# Patient Record
Sex: Female | Born: 1970 | Race: White | Hispanic: No | Marital: Married | Smoking: Never smoker
Health system: Southern US, Community
[De-identification: ages and names within clinical notes are randomized; demographics above are authoritative.]

## PROBLEM LIST (undated history)

## (undated) DIAGNOSIS — C801 Malignant (primary) neoplasm, unspecified: Secondary | ICD-10-CM

## (undated) DIAGNOSIS — B1920 Unspecified viral hepatitis C without hepatic coma: Secondary | ICD-10-CM

## (undated) DIAGNOSIS — N390 Urinary tract infection, site not specified: Secondary | ICD-10-CM

## (undated) HISTORY — DX: Malignant (primary) neoplasm, unspecified: C80.1

## (undated) HISTORY — DX: Unspecified viral hepatitis C without hepatic coma: B19.20

## (undated) HISTORY — PX: TUBAL LIGATION: SHX77

## (undated) HISTORY — DX: Urinary tract infection, site not specified: N39.0

---
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
## 1989-04-22 HISTORY — PX: CHOLECYSTECTOMY: SHX55

## 2005-04-22 HISTORY — PX: NECK SURGERY: SHX720

## 2005-09-17 ENCOUNTER — Ambulatory Visit: Payer: Self-pay | Admitting: Gastroenterology

## 2007-04-08 ENCOUNTER — Encounter (INDEPENDENT_AMBULATORY_CARE_PROVIDER_SITE_OTHER): Payer: Self-pay | Admitting: Interventional Radiology

## 2007-04-08 ENCOUNTER — Ambulatory Visit (HOSPITAL_COMMUNITY): Admission: RE | Admit: 2007-04-08 | Discharge: 2007-04-08 | Payer: Self-pay | Admitting: Gastroenterology

## 2007-04-21 ENCOUNTER — Ambulatory Visit: Payer: Self-pay | Admitting: Gastroenterology

## 2009-04-22 DIAGNOSIS — C801 Malignant (primary) neoplasm, unspecified: Secondary | ICD-10-CM

## 2009-04-22 DIAGNOSIS — C50919 Malignant neoplasm of unspecified site of unspecified female breast: Secondary | ICD-10-CM

## 2009-04-22 HISTORY — DX: Malignant (primary) neoplasm, unspecified: C80.1

## 2009-04-22 HISTORY — PX: BREAST BIOPSY: SHX20

## 2009-04-22 HISTORY — PX: REDUCTION MAMMAPLASTY: SUR839

## 2009-04-22 HISTORY — PX: MASTECTOMY: SHX3

## 2009-04-22 HISTORY — DX: Malignant neoplasm of unspecified site of unspecified female breast: C50.919

## 2011-01-25 LAB — CBC
MCHC: 33.8
Platelets: 241
RBC: 5.03
RDW: 13.1

## 2013-04-28 LAB — HM PAP SMEAR: HM Pap smear: NORMAL

## 2014-04-22 LAB — HM MAMMOGRAPHY: HM Mammogram: NORMAL

## 2014-11-21 HISTORY — PX: NOVASURE ABLATION: SHX5394

## 2015-08-11 ENCOUNTER — Telehealth: Payer: Self-pay | Admitting: Behavioral Health

## 2015-08-11 ENCOUNTER — Encounter: Payer: Self-pay | Admitting: Behavioral Health

## 2015-08-11 NOTE — Telephone Encounter (Signed)
Pre-Visit Call completed with patient and chart updated.   Pre-Visit Info documented in Specialty Comments under SnapShot.    

## 2015-08-14 ENCOUNTER — Ambulatory Visit (INDEPENDENT_AMBULATORY_CARE_PROVIDER_SITE_OTHER): Payer: Managed Care, Other (non HMO) | Admitting: Family Medicine

## 2015-08-14 ENCOUNTER — Encounter: Payer: Self-pay | Admitting: Family Medicine

## 2015-08-14 VITALS — BP 131/86 | HR 72 | Temp 98.3°F | Ht 64.17 in | Wt 164.2 lb

## 2015-08-14 DIAGNOSIS — R300 Dysuria: Secondary | ICD-10-CM | POA: Diagnosis not present

## 2015-08-14 DIAGNOSIS — Z1329 Encounter for screening for other suspected endocrine disorder: Secondary | ICD-10-CM | POA: Diagnosis not present

## 2015-08-14 DIAGNOSIS — Z0000 Encounter for general adult medical examination without abnormal findings: Secondary | ICD-10-CM | POA: Diagnosis not present

## 2015-08-14 DIAGNOSIS — Z13220 Encounter for screening for lipoid disorders: Secondary | ICD-10-CM

## 2015-08-14 DIAGNOSIS — Z131 Encounter for screening for diabetes mellitus: Secondary | ICD-10-CM

## 2015-08-14 DIAGNOSIS — Z23 Encounter for immunization: Secondary | ICD-10-CM | POA: Diagnosis not present

## 2015-08-14 DIAGNOSIS — F411 Generalized anxiety disorder: Secondary | ICD-10-CM | POA: Insufficient documentation

## 2015-08-14 DIAGNOSIS — Z130 Encounter for screening for diseases of the blood and blood-forming organs and certain disorders involving the immune mechanism: Secondary | ICD-10-CM | POA: Diagnosis not present

## 2015-08-14 DIAGNOSIS — G4700 Insomnia, unspecified: Secondary | ICD-10-CM | POA: Insufficient documentation

## 2015-08-14 LAB — COMPREHENSIVE METABOLIC PANEL
ALK PHOS: 35 U/L — AB (ref 39–117)
ALT: 11 U/L (ref 0–35)
AST: 13 U/L (ref 0–37)
Albumin: 3.9 g/dL (ref 3.5–5.2)
BUN: 12 mg/dL (ref 6–23)
CALCIUM: 9.4 mg/dL (ref 8.4–10.5)
CO2: 30 mEq/L (ref 19–32)
Chloride: 103 mEq/L (ref 96–112)
Creatinine, Ser: 0.73 mg/dL (ref 0.40–1.20)
GFR: 91.38 mL/min (ref >60.00)
GLUCOSE: 94 mg/dL (ref 70–99)
POTASSIUM: 4.1 meq/L (ref 3.5–5.1)
Sodium: 139 mEq/L (ref 135–145)
TOTAL PROTEIN: 7.2 g/dL (ref 6.0–8.3)
Total Bilirubin: 0.5 mg/dL (ref 0.2–1.2)

## 2015-08-14 LAB — POC URINALSYSI DIPSTICK (AUTOMATED)
Bilirubin, UA: NEGATIVE
Blood, UA: NEGATIVE
GLUCOSE UA: NEGATIVE
LEUKOCYTES UA: NEGATIVE
NITRITE UA: NEGATIVE
PROTEIN UA: NEGATIVE
SPEC GRAV UA: 1.02
UROBILINOGEN UA: 0.2
pH, UA: 6

## 2015-08-14 LAB — LIPID PANEL
CHOL/HDL RATIO: 4
CHOLESTEROL: 209 mg/dL — AB (ref 0–200)
HDL: 57.2 mg/dL (ref >39.00)
LDL Cholesterol: 134 mg/dL — ABNORMAL HIGH (ref 0–99)
NonHDL: 151.74
TRIGLYCERIDES: 91 mg/dL (ref 0.0–149.0)
VLDL: 18.2 mg/dL (ref 0.0–40.0)

## 2015-08-14 LAB — CBC
HEMATOCRIT: 43.4 % (ref 36.0–46.0)
HEMOGLOBIN: 14.3 g/dL (ref 12.0–15.0)
MCHC: 33 g/dL (ref 30.0–36.0)
MCV: 86.8 fl (ref 78.0–100.0)
PLATELETS: 209 10*3/uL (ref 150.0–400.0)
RBC: 5 Mil/uL (ref 3.87–5.11)
RDW: 14.4 % (ref 11.5–15.5)
WBC: 7.5 10*3/uL (ref 4.0–10.5)

## 2015-08-14 LAB — HEMOGLOBIN A1C: Hgb A1c MFr Bld: 5.1 % (ref 4.6–6.5)

## 2015-08-14 LAB — TSH: TSH: 2.33 u[IU]/mL (ref 0.35–4.50)

## 2015-08-14 MED ORDER — ZOLPIDEM TARTRATE 10 MG PO TABS: ORAL_TABLET | ORAL | Status: DC

## 2015-08-14 MED ORDER — CLONAZEPAM 0.5 MG PO TABS: 0.5000 mg | ORAL_TABLET | Freq: Every day | ORAL | Status: DC

## 2015-08-14 NOTE — Patient Instructions (Signed)
It was very nice to meet you today!  I will be in touch with your labs asap.  If you like, set up your mychart account for easy communication We will screening for diabetes, thyroid issues, high cholesterol and anemia You got your tetanus shot today Continue to use the ambien for sleep and the klonpin for anxiety- use these medications as sparingly as you can and do not combine with alcohol or drive while on these medications. Do not use these medications together

## 2015-08-14 NOTE — Progress Notes (Signed)
Pre visit review using our clinic review tool, if applicable. No additional management support is needed unless otherwise documented below in the visit note. 

## 2015-08-14 NOTE — Progress Notes (Addendum)
[place] at [facility] [address] [place], [place] [address] [phone]  Date:  08/14/2015   Name:  Shirley Holder   DOB:  [birth year]   MRN:  [id number]  PCP:  Lamar Blinks, MD    Chief Complaint: Establish Care   History of Present Illness:  Shirley Holder is a 45 y.o. very pleasant female patient who presents with the following:  Here today to establish care.   She has been seeing Dr. Tamala Julian for her pap,etc but would like to change to our office She was dx with breast cancer in 2011- she had a left mastectomy.  She just had surgical treatment, did not need chemo or radiation.  she will schedule her follow- up mammogram soon and will have the results sent to me She had a novsure ablation about one year ago due to menorrhagia- this is now improved  She did have some neck surgery about 10 years ago- her neck is doing well.  She was treated for hep C 4 years ago- she was treated and infection cleared.  She was told to see her hepatologist every 5 years at this point.    She has had some trouble with her right heel for 3 weeks or so.  It hurts the worse when she gets up in the am.  She has tried some ice.  NKI.  She did walk around a lot in some flat shoes which seemed to trigger the pain.   Will do labs today.   She has 46 and 26 yo children- they are both doing ok.  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    Last pap about 2 years ago.  No recent abnl  She uses ambien at bedtime for sleep- she has a hard time turning her mind off.  She has used [place] for 5 years or so. She does not use it every day- she takes 5 mg maybe 4x a week She also does use klonpin maybe 1-2x a week for anxiety   NCCSR shows a fill of ambien and klonopin- 30 each- on 04/14/15.  No other entries  There are no active problems to display for this patient.   Past Medical History  Diagnosis Date  . Hepatitis C   . Cancer [facility])    Breast Cancer  . Urinary tract infection     Past Surgical History  Procedure Laterality Date  . Mastectomy Left 2011  . Neck surgery  2007    Neck Fusion; Ruptured Disk  . Novasure ablation  11/2014  . Tonsillectomy  1988  . Cholecystectomy  1991  . Abdominal hysterectomy  1997  . Breast biopsy  2011    Social History  Substance Use Topics  . Smoking status: Never Smoker   . Smokeless tobacco: Never Used  . Alcohol Use: 1.2 oz/week    2 Glasses of wine per week     Comment: Occasional    Family History  Problem Relation Age of Onset  . Heart disease Mother   . Heart disease Father   . Heart attack Father   . Diabetes Neg Hx     No Known Allergies  Medication list has been reviewed and updated.  Current Outpatient Prescriptions on File Prior to Visit  Medication Sig Dispense Refill  . clonazePAM (KLONOPIN) 0.5 MG tablet Take 0.5 mg by mouth as needed for anxiety.    Marland Kitchen zolpidem (AMBIEN) 10 MG  tablet Take 10 mg by mouth at bedtime.     No current facility-administered medications on file prior to visit.    Review of Systems:  As per HPI- otherwise negative.   Physical Examination: Filed Vitals:   08/14/15 1056  BP: 131/86  Pulse: 72  Temp: 98.3 F (36.8 C)   Filed Vitals:   08/14/15 1056  Height: 5' 4.17" (1.63 m)  Weight: 164 lb 3.2 oz (74.481 kg)   Body mass index is 28.03 kg/(m^2). Ideal Body Weight: Weight in (lb) to have BMI = 25: 146.1  GEN: WDWN, NAD, Non-toxic, A & O x 3, overweight, looks well HEENT: Atraumatic, Normocephalic. Neck supple. No masses, No LAD.  Bilateral TM wnl, oropharynx normal.  PEERL,EOMI.   Ears and Nose: No external deformity. CV: RRR, No M/G/R. No JVD. No thrill. No extra heart sounds. PULM: CTA B, no wheezes, crackles, rhonchi. No retractions. No resp. distress. No accessory muscle use. ABD: S, NT, ND, +BS. No rebound. No HSM. EXTR: No c/c/e NEURO Normal gait.  PSYCH: Normally interactive. Conversant. Not depressed  or anxious appearing.  Calm demeanor.  Right foot:  Mild tenderness at right plantar fascia insertion   Assessment and Plan: Physical exam  Screening for deficiency anemia - Plan: CBC  Screening for diabetes mellitus - Plan: Comprehensive metabolic panel, Hemoglobin A1c  Screening for thyroid disorder - Plan: TSH  Dysuria - Plan: POCT urinalysis dipstick, Urine culture  Screening for hyperlipidemia - Plan: Lipid panel  Insomnia - Plan: zolpidem (AMBIEN) 10 MG tablet  Anxiety state - Plan: clonazePAM (KLONOPIN) 0.5 MG tablet  Immunization due - Plan: Tdap vaccine greater than or equal to 7yo IM  Establish care and CPE today Tetanus due- updated today Refilled her ambien and klonopin that she used on a prn basis and discussed safety with these meds She has mild plantar fascia sx- for the time being she will continue ice and stretching at home Will plan further follow- up pending labs.   Signed Lamar Blinks, MD  Received her labs 4/25- she does have a UTI.  Called and LMOM that I will send in an abx.  rx to macrobid to her drug store, letter to pt with labs  Results for orders placed or performed in visit on 08/14/15  Urine culture  Result Value Ref Range   Colony Count >=100,000 COLONIES/ML    Preliminary Report Gram Negative Rods   CBC  Result Value Ref Range   WBC 7.5 4.0 - 10.5 K/uL   RBC 5.00 3.87 - 5.11 Mil/uL   Platelets 209.0 150.0 - 400.0 K/uL   Hemoglobin 14.3 12.0 - 15.0 g/dL   HCT 43.4 36.0 - 46.0 %   MCV 86.8 78.0 - 100.0 fl   MCHC 33.0 30.0 - 36.0 g/dL   RDW 14.4 11.5 - 15.5 %  Comprehensive metabolic panel  Result Value Ref Range   Sodium 139 135 - 145 mEq/L   Potassium 4.1 3.5 - 5.1 mEq/L   Chloride 103 96 - 112 mEq/L   CO2 30 19 - 32 mEq/L   Glucose, Bld 94 70 - 99 mg/dL   BUN 12 6 - 23 mg/dL   Creatinine, Ser 0.73 0.40 - 1.20 mg/dL   Total Bilirubin 0.5 0.2 - 1.2 mg/dL   Alkaline Phosphatase 35 (L) 39 - 117 U/L   AST 13 0 - 37 U/L   ALT  11 0 - 35 U/L   Total Protein 7.2 6.0 - 8.3 g/dL  Albumin 3.9 3.5 - 5.2 g/dL   Calcium 9.4 8.4 - 10.5 mg/dL   GFR 91.38 >60.00 mL/min  TSH  Result Value Ref Range   TSH 2.33 0.35 - 4.50 uIU/mL  Lipid panel  Result Value Ref Range   Cholesterol 209 (H) 0 - 200 mg/dL   Triglycerides 91.0 0.0 - 149.0 mg/dL   HDL 57.20 >39.00 mg/dL   VLDL 18.2 0.0 - 40.0 mg/dL   LDL Cholesterol 134 (H) 0 - 99 mg/dL   Total CHOL/HDL Ratio 4    NonHDL 151.74   Hemoglobin A1c  Result Value Ref Range   Hgb A1c MFr Bld 5.1 4.6 - 6.5 %  POCT Urinalysis Dipstick (Automated)  Result Value Ref Range   Color, UA amber    Clarity, UA cloudy    Glucose, UA neg    Bilirubin, UA neg    Ketones, UA 2+    Spec Grav, UA 1.020    Blood, UA neg    pH, UA 6.0    Protein, UA neg    Urobilinogen, UA 0.2    Nitrite, UA neg    Leukocytes, UA Negative Negative

## 2015-08-15 ENCOUNTER — Encounter: Payer: Self-pay | Admitting: Family Medicine

## 2015-08-15 MED ORDER — NITROFURANTOIN MONOHYD MACRO 100 MG PO CAPS: 100.0000 mg | ORAL_CAPSULE | Freq: Two times a day (BID) | ORAL | Status: DC

## 2015-08-15 NOTE — Addendum Note (Signed)
Addended by: Lamar Blinks C on: 08/15/2015 12:44 PM   Modules accepted: Orders

## 2015-08-16 LAB — URINE CULTURE: Colony Count: >=100000

## 2015-09-04 ENCOUNTER — Ambulatory Visit (INDEPENDENT_AMBULATORY_CARE_PROVIDER_SITE_OTHER): Payer: Managed Care, Other (non HMO) | Admitting: Family Medicine

## 2015-09-04 ENCOUNTER — Encounter: Payer: Self-pay | Admitting: Family Medicine

## 2015-09-04 ENCOUNTER — Ambulatory Visit (HOSPITAL_BASED_OUTPATIENT_CLINIC_OR_DEPARTMENT_OTHER)
Admission: RE | Admit: 2015-09-04 | Discharge: 2015-09-04 | Disposition: A | Discharge status: Home / Self Care | Payer: Managed Care, Other (non HMO) | Source: Ambulatory Visit | Attending: Family Medicine | Admitting: Family Medicine

## 2015-09-04 VITALS — BP 124/73 | HR 68 | Temp 98.2°F | Ht 64.0 in | Wt 164.8 lb

## 2015-09-04 DIAGNOSIS — M7731 Calcaneal spur, right foot: Secondary | ICD-10-CM | POA: Insufficient documentation

## 2015-09-04 DIAGNOSIS — M722 Plantar fascial fibromatosis: Secondary | ICD-10-CM | POA: Diagnosis not present

## 2015-09-04 DIAGNOSIS — M79671 Pain in right foot: Secondary | ICD-10-CM

## 2015-09-04 MED ORDER — MELOXICAM 15 MG PO TABS
15.0000 mg | ORAL_TABLET | Freq: Every day | ORAL | Status: DC
Start: 2015-09-04 — End: 2017-02-05

## 2015-09-04 NOTE — Progress Notes (Signed)
[place] at [facility] [address] [place], [place] [address] [phone]  Date:  09/04/2015   Name:  Shirley Holder   DOB:  1970/04/27   MRN:  [id number]  PCP:  Lamar Blinks, MD    Chief Complaint: Right heel pain   History of Present Illness:  Shirley Holder is a 45 y.o. very pleasant female patient who presents with the following:  Here today to discuss persistent heel pain.  When I saw her last about 3 weeks ago she  had noted right heel pain for 3 weeks. She tried ice, stretching for presumed plantar fascitis.   Her heel is still bothering her  She feels "like a shock and needles" in her foot which has been present off an on.  Pain in her foot now for 6 weeks total She is using a padded heel cup which helps a bit. However when she comes home at night it continues to hurt.  The left foot is just fine.   NKI Never had this in the past She is not wearing a night splint yet but is interested in doing this LMP 08/16/2015  Patient Active Problem List   Diagnosis Date Noted  . Insomnia 08/14/2015  . Anxiety state 08/14/2015    Past Medical History  Diagnosis Date  . Hepatitis C   . Cancer [facility])     Breast Cancer  . Urinary tract infection     Past Surgical History  Procedure Laterality Date  . Mastectomy Left 2011  . Neck surgery  2007    Neck Fusion; Ruptured Disk  . Novasure ablation  11/2014  . Tonsillectomy  1988  . Cholecystectomy  1991  . Breast biopsy  2011  . Tubal ligation      Social History  Substance Use Topics  . Smoking status: Never Smoker   . Smokeless tobacco: Never Used  . Alcohol Use: 1.2 oz/week    2 Glasses of wine per week     Comment: Occasional    Family History  Problem Relation Age of Onset  . Heart disease Mother   . Heart disease Father   . Heart attack Father   . Diabetes Neg Hx     No Known Allergies  Medication list has been reviewed and updated.  Current  Outpatient Prescriptions on File Prior to Visit  Medication Sig Dispense Refill  . clonazePAM (KLONOPIN) 0.5 MG tablet Take 1 tablet (0.5 mg total) by mouth daily. Use as needed for anxiety 30 tablet 0  . ibuprofen (ADVIL,MOTRIN) 200 MG tablet Take 200 mg by mouth as needed.    . zolpidem (AMBIEN) 10 MG tablet Take 5 mg by mouth at bedtime as needed for insomnia 30 tablet 1   No current facility-administered medications on file prior to visit.    Review of Systems:  As per HPI- otherwise negative.   Physical Examination: Filed Vitals:   09/04/15 0905  BP: 124/73  Pulse: 68  Temp: 98.2 F (36.8 C)   Filed Vitals:   09/04/15 0905  Height: 5' 4"  (1.626 m)  Weight: 164 lb 12.8 oz (74.753 kg)   Body mass index is 28.27 kg/(m^2). Ideal Body Weight: Weight in (lb) to have BMI = 25: 145.3  GEN: WDWN, NAD, Non-toxic, A & O x 3 HEENT: Atraumatic, Normocephalic. Neck supple. No masses, No LAD. Ears and Nose: No external deformity. CV: RRR, No M/G/R. No JVD. No thrill. No extra heart sounds.  PULM: CTA B, no wheezes, crackles, rhonchi. No retractions. No resp. distress. No accessory muscle use. EXTR: No c/c/e NEURO Normal gait.  PSYCH: Normally interactive. Conversant. Not depressed or anxious appearing.  Calm demeanor.  Right foot: tender at the insertion of the right plantar fascia at the heel.  No bony tenderness. No swelling, no heat or redness  Films of right foot negative except for small heel spur  Dg Os Calcis Right  09/04/2015  CLINICAL DATA:  Hindfoot pain for 2 months EXAM: RIGHT OS CALCIS - 2+ VIEW COMPARISON:  None. FINDINGS: Lateral and Harris views were obtained. There are posterior and inferior calcaneal spurs. No fracture or dislocation. The joint spaces appear normal. No erosive change. No soft tissue calcification. IMPRESSION: Calcaneal spurs. No fracture or dislocation. No apparent arthropathy. Electronically Signed   By: Lowella Grip III M.D.   On: 09/04/2015  09:35   Dg Foot Complete Right  09/04/2015  CLINICAL DATA:  Two-month history of hindfoot pain EXAM: RIGHT FOOT COMPLETE - 3+ VIEW COMPARISON:  None. FINDINGS: Frontal, oblique, and lateral views were obtained. There is no acute fracture or dislocation. There is no appreciable joint space narrowing. There is a spur along the proximal dorsal navicular. There are inferior and posterior calcaneal spurs. No erosive change. IMPRESSION: Calcaneal spurs. 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 No fracture or dislocation. No erosive change. Electronically Signed   By: Lowella Grip III M.D.   On: 09/04/2015 09:35     Assessment and Plan: Plantar fasciitis, right - Plan: meloxicam (MOBIC) 15 MG tablet  Foot pain, right - Plan: DG Foot Complete Right, DG Os Calcis Right  Here today with persistent PF symptoms. Discussed doing a steroid injection but she prefers to hold off for now.  Will have her use mobic for 2-3 weeks daily (avoid other NSAIDs concurrently) and continue other conservative measures.  She will also get a night splint to use She will let me know if she does want to do the injection  Signed Lamar Blinks, MD

## 2015-09-04 NOTE — Patient Instructions (Addendum)
Please go downstairs for x-ray. Then come back up and we will discuss your results  Your films do not show any sign of fracture Try a night splint, continue stretches and icing, and use the mobic once a day for 2-3 weeks. At that point please let me know how you are doing with your foot If you continue to have symptoms I would try the injection

## 2015-09-04 NOTE — Progress Notes (Signed)
Pre visit review using our clinic review tool, if applicable. No additional management support is needed unless otherwise documented below in the visit note. 

## 2015-09-15 ENCOUNTER — Telehealth: Payer: Self-pay | Admitting: *Deleted

## 2015-09-15 NOTE — Telephone Encounter (Signed)
Forwarded to Dr. Liana Crocker. JG//CMA

## 2015-09-21 ENCOUNTER — Encounter: Payer: Self-pay | Admitting: Family Medicine

## 2015-09-21 DIAGNOSIS — M8580 Other specified disorders of bone density and structure, unspecified site: Secondary | ICD-10-CM | POA: Insufficient documentation

## 2015-09-21 DIAGNOSIS — E559 Vitamin D deficiency, unspecified: Secondary | ICD-10-CM | POA: Insufficient documentation

## 2015-09-21 DIAGNOSIS — B1920 Unspecified viral hepatitis C without hepatic coma: Secondary | ICD-10-CM | POA: Insufficient documentation

## 2015-09-21 DIAGNOSIS — Z86000 Personal history of in-situ neoplasm of breast: Secondary | ICD-10-CM | POA: Insufficient documentation

## 2015-09-21 DIAGNOSIS — E0590 Thyrotoxicosis, unspecified without thyrotoxic crisis or storm: Secondary | ICD-10-CM | POA: Insufficient documentation

## 2015-09-21 DIAGNOSIS — E785 Hyperlipidemia, unspecified: Secondary | ICD-10-CM | POA: Insufficient documentation

## 2015-09-26 ENCOUNTER — Encounter: Payer: Self-pay | Admitting: Family Medicine

## 2015-10-15 ENCOUNTER — Other Ambulatory Visit: Payer: Self-pay | Admitting: Family Medicine

## 2015-10-15 DIAGNOSIS — F411 Generalized anxiety disorder: Secondary | ICD-10-CM

## 2015-10-16 MED ORDER — CLONAZEPAM 0.5 MG PO TABS: 0.5000 mg | ORAL_TABLET | Freq: Every day | ORAL | Status: DC

## 2015-12-31 ENCOUNTER — Encounter: Payer: Self-pay | Admitting: Family Medicine

## 2015-12-31 DIAGNOSIS — G4700 Insomnia, unspecified: Secondary | ICD-10-CM

## 2016-01-02 MED ORDER — ZOLPIDEM TARTRATE 10 MG PO TABS: ORAL_TABLET | ORAL | 0 refills | Status: DC

## 2016-02-01 ENCOUNTER — Ambulatory Visit (INDEPENDENT_AMBULATORY_CARE_PROVIDER_SITE_OTHER): Payer: Managed Care, Other (non HMO) | Admitting: Family Medicine

## 2016-02-01 ENCOUNTER — Encounter: Payer: Self-pay | Admitting: Family Medicine

## 2016-02-01 VITALS — BP 136/90 | HR 67 | Temp 97.7°F | Ht 64.0 in | Wt 168.2 lb

## 2016-02-01 DIAGNOSIS — R635 Abnormal weight gain: Secondary | ICD-10-CM | POA: Diagnosis not present

## 2016-02-01 DIAGNOSIS — J0110 Acute frontal sinusitis, unspecified: Secondary | ICD-10-CM | POA: Diagnosis not present

## 2016-02-01 DIAGNOSIS — Z8619 Personal history of other infectious and parasitic diseases: Secondary | ICD-10-CM | POA: Diagnosis not present

## 2016-02-01 DIAGNOSIS — G4700 Insomnia, unspecified: Secondary | ICD-10-CM | POA: Diagnosis not present

## 2016-02-01 MED ORDER — ZOLPIDEM TARTRATE 10 MG PO TABS: ORAL_TABLET | ORAL | 1 refills | Status: DC

## 2016-02-01 MED ORDER — AMOXICILLIN 500 MG PO CAPS: 1000.0000 mg | ORAL_CAPSULE | Freq: Two times a day (BID) | ORAL | 0 refills | Status: DC

## 2016-02-01 NOTE — Patient Instructions (Signed)
It was good to see you today- we will treat you for a sinus infection with amoxicillin.  Use this as directed and let me know if you are not feeling better soon I refilled your Lorrin Mais- remember not to combine this medication with any other sedating medications or alcohol

## 2016-02-01 NOTE — Progress Notes (Signed)
Pre visit review using our clinic review tool, if applicable. No additional management support is needed unless otherwise documented below in the visit note. 

## 2016-02-01 NOTE — Progress Notes (Signed)
[place] at [facility] [address] [place], [place] [address] [phone]  Date:  02/01/2016   Name:  Shirley Holder   DOB:  [birth year]   MRN:  [id number]  PCP:  Lamar Blinks, MD    Chief Complaint: Sinus Problem (c/o sinus pressure, HA, occ dry cough x 2 weeks. Would like ot discuss weight gain. )   History of Present Illness:  Shirley Holder is a 45 y.o. very pleasant female patient who presents with the following:  Here today with concern of illness- she noted what she thought was a cold about 2 weeks ago. Her sx started with a scratchy throat, felt tired and achy.  However over the last 3 days she has noted a lot of sinus pressure and pain.   She has not noted any fever.  She was coughing but now her sx are mostly in her frontal sinuses.  She notes tenderness in her maxillary sinuses.  Notes a feeling of pressure in her sinuses as well No ST or earache No GI symptoms  Her daughter has been sick as well.    She has tried some flonase.    She notes that she has gained 15 lbs in the last year or so. She used phentermine in the past. Wonders if she should ust his again. Admits that she hast not been following the best diet recently   She is considered cured of hep C through treatment- cleared for the last 3 years .  This is great news!   Wt Readings from Last 3 Encounters:  02/01/16 168 lb 3.2 oz (76.3 kg)  09/04/15 164 lb 12.8 oz (74.8 kg)  08/14/15 164 lb 3.2 oz (74.5 kg)     Patient Active Problem List   Diagnosis Date Noted  . Hepatitis C 09/21/2015  . Hyperlipidemia 09/21/2015  . 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 Osteopenia 09/21/2015  . Vitamin D deficiency 09/21/2015  . Hyperthyroidism 09/21/2015  . Insomnia 08/14/2015  . Anxiety state 08/14/2015    Past Medical History:  Diagnosis Date  . Cancer [facility])    Breast Cancer  . Hepatitis C   . Urinary tract infection      Past Surgical History:  Procedure Laterality Date  . BREAST BIOPSY  2011  . CHOLECYSTECTOMY  1991  . MASTECTOMY Left 2011  . NECK SURGERY  2007   Neck Fusion; Ruptured Disk  . NOVASURE ABLATION  11/2014  . TONSILLECTOMY  1988  . TUBAL LIGATION      Social History  Substance Use Topics  . Smoking status: Never Smoker  . Smokeless tobacco: Never Used  . Alcohol use 1.2 oz/week    2 Glasses of wine per week     Comment: Occasional    Family History  Problem Relation Age of Onset  . Heart disease Mother   . Heart disease Father   . Heart attack Father   . Diabetes Neg Hx     No Known Allergies  Medication list has been reviewed and updated.  Current Outpatient Prescriptions on File Prior to Visit  Medication Sig Dispense Refill  . clonazePAM (KLONOPIN) 0.5 MG tablet Take 1 tablet (0.5 mg total) by mouth daily. Use as needed for anxiety 30 tablet 0  . ibuprofen (ADVIL,MOTRIN) 200 MG tablet Take 200 mg by mouth as needed.    . meloxicam (MOBIC) 15 MG tablet Take 1 tablet (15 mg total)  by mouth daily. Use as needed for foot pain 30 tablet 0  . zolpidem (AMBIEN) 10 MG tablet Take 5 mg by mouth at bedtime as needed for insomnia 30 tablet 0   No current facility-administered medications on file prior to visit.     Review of Systems:  As per HPI- otherwise negative. She does need her Lorrin Mais- she rarely needs the klonpin   Physical Examination: Vitals:   02/01/16 1650  BP: 136/90  Pulse: 67  Temp: 97.7 F (36.5 C)   Vitals:   02/01/16 1650  Weight: 168 lb 3.2 oz (76.3 kg)  Height: 5' 4"  (1.626 m)   Body mass index is 28.87 kg/m. Ideal Body Weight: Weight in (lb) to have BMI = 25: 145.3  GEN: WDWN, NAD, Non-toxic, A & O x 3, looks well, overweight HEENT: Atraumatic, Normocephalic. Neck supple. No masses, No LAD.  Bilateral TM wnl, oropharynx normal.  PEERL,EOMI.   Nasal cavity is inflamed with discharge and frontal sinus tenderness to percussion  Ears and  Nose: No external deformity. CV: RRR, No M/G/R. No JVD. No thrill. No extra heart sounds. PULM: CTA B, no wheezes, crackles, rhonchi. No retractions. No resp. distress. No accessory muscle use. ABD: S, NT, ND EXTR: No c/c/e NEURO Normal gait.  PSYCH: Normally interactive. Conversant. Not depressed or anxious appearing.  Calm demeanor.    Assessment and Plan: Acute frontal sinusitis, recurrence not specified - Plan: amoxicillin (AMOXIL) 500 MG capsule  History of hepatitis C  Insomnia, unspecified type - Plan: zolpidem (AMBIEN) 10 MG tablet  Weight gain  Discussed her weight gain- at this time I do not think that weight loss drugs are indicated as her BMI is 28 and she does not have any co-morbidities.  She will work on lifestyle modification instead Refilled her Lorrin Mais which she will need to fill soon Updated her problem list- hep C is cured!  Treat for sinusitis with amoxicillin- she will let me know if not better in the next few days- Sooner if worse.     Signed Lamar Blinks, MD

## 2016-05-01 ENCOUNTER — Ambulatory Visit (INDEPENDENT_AMBULATORY_CARE_PROVIDER_SITE_OTHER): Payer: Managed Care, Other (non HMO) | Admitting: Family Medicine

## 2016-05-01 ENCOUNTER — Encounter: Payer: Self-pay | Admitting: Family Medicine

## 2016-05-01 VITALS — BP 134/84 | HR 78 | Temp 98.1°F | Ht 64.0 in | Wt 169.2 lb

## 2016-05-01 DIAGNOSIS — M7062 Trochanteric bursitis, left hip: Secondary | ICD-10-CM

## 2016-05-01 DIAGNOSIS — G4700 Insomnia, unspecified: Secondary | ICD-10-CM

## 2016-05-01 MED ORDER — METHYLPREDNISOLONE ACETATE 40 MG/ML IJ SUSP
40.0000 mg | Freq: Once | INTRAMUSCULAR | Status: AC
  Administered 2016-05-01: 40 mg via INTRAMUSCULAR

## 2016-05-01 MED ORDER — ZOLPIDEM TARTRATE 10 MG PO TABS: ORAL_TABLET | ORAL | 1 refills | Status: DC

## 2016-05-01 NOTE — Progress Notes (Signed)
Pre visit review using our clinic review tool, if applicable. No additional management support is needed unless otherwise documented below in the visit note. 

## 2016-05-01 NOTE — Progress Notes (Signed)
[place] at [facility] [address] [place], [place] [address] [phone]  Date:  05/01/2016   Name:  Shirley Holder   DOB:  1971-01-04   MRN:  [id number]  PCP:  Lamar Blinks, MD    Chief Complaint: Leg Pain (Mostly left leg--starts at hip and radiates to left lower calf; bilateral legs at night. Started 1 month ago.  ) and Medication Refill Lorrin Mais)   History of Present Illness:  Shirley Holder is a 46 y.o. very pleasant female patient who presents with the following:  History of DCIS 2011, hep C /sp cure She has noted a pain in her left leg from the hip - it has been present for about one month.  Will wax and wane.    She does not have this issue in her right leg It feels like nerve pain- she will want to rub or shake the leg  She is generally ok with she is up and walking, but sitting or laying down can exacerbate her pain. It is especially bad at night.  She is taking aleve as needed  Trying to climb into her daughter's jeep can be painful for her.  No numbness in her leg, no difficulty with bowel or bladder control She does not generally have back pain but can noted some mild lower back ache at night  Her last mammo in May was normal- no recurrence of her breast cancer  She continues to use ambien as needed for her insomnia and needs a RF for next month Consulted NCCSR: no concerning findings.    Patient Active Problem List   Diagnosis Date Noted  . History of hepatitis C 02/01/2016  . Hyperlipidemia 09/21/2015  . 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 Osteopenia 09/21/2015  . Vitamin D deficiency 09/21/2015  . Hyperthyroidism 09/21/2015  . Insomnia 08/14/2015  . Anxiety state 08/14/2015    Past Medical History:  Diagnosis Date  . Cancer [facility])    Breast Cancer  . Hepatitis C   . Urinary tract infection     Past Surgical History:  Procedure Laterality Date  . BREAST BIOPSY  2011   . CHOLECYSTECTOMY  1991  . MASTECTOMY Left 2011  . NECK SURGERY  2007   Neck Fusion; Ruptured Disk  . NOVASURE ABLATION  11/2014  . TONSILLECTOMY  1988  . TUBAL LIGATION      Social History  Substance Use Topics  . Smoking status: Never Smoker  . Smokeless tobacco: Never Used  . Alcohol use 1.2 oz/week    2 Glasses of wine per week     Comment: Occasional    Family History  Problem Relation Age of Onset  . Heart disease Mother   . Heart disease Father   . Heart attack Father   . Diabetes Neg Hx     No Known Allergies  Medication list has been reviewed and updated.  Current Outpatient Prescriptions on File Prior to Visit  Medication Sig Dispense Refill  . clonazePAM (KLONOPIN) 0.5 MG tablet Take 1 tablet (0.5 mg total) by mouth daily. Use as needed for anxiety 30 tablet 0  . ibuprofen (ADVIL,MOTRIN) 200 MG tablet Take 200 mg by mouth as needed.    . zolpidem (AMBIEN) 10 MG tablet Take 5 mg by mouth at bedtime as needed for insomnia 30 tablet 1  . amoxicillin (AMOXIL) 500 MG capsule Take 2 capsules (1,000 mg total) by mouth  2 (two) times daily. (Patient not taking: Reported on 05/01/2016) 40 capsule 0  . meloxicam (MOBIC) 15 MG tablet Take 1 tablet (15 mg total) by mouth daily. Use as needed for foot pain (Patient not taking: Reported on 05/01/2016) 30 tablet 0   No current facility-administered medications on file prior to visit.     Review of Systems:  As per HPI- otherwise negative.   Physical Examination: Vitals:   05/01/16 1637  BP: 134/84  Pulse: 78  Temp: 98.1 F (36.7 C)   Vitals:   05/01/16 1637  Weight: 169 lb 3.2 oz (76.7 kg)  Height: 5' 4"  (1.626 m)   Body mass index is 29.04 kg/m. Ideal Body Weight: Weight in (lb) to have BMI = 25: 145.3  GEN: WDWN, NAD, Non-toxic, A & O x 3, overweight, looks well HEENT: Atraumatic, Normocephalic. Neck supple. No masses, No LAD. Ears and Nose: No external deformity. CV: RRR, No M/G/R. No JVD. No thrill. No  extra heart sounds. PULM: CTA B, no wheezes, crackles, rhonchi. No retractions. No resp. distress. No accessory muscle use. ABD: S, NT, ND EXTR: No c/c/e NEURO Normal gait.  PSYCH: Normally interactive. Conversant. Not depressed or anxious appearing.  Calm demeanor.  She is not tender over her bony spine or left sciatic notch.  She is tender over the left greater trochanteric bursa.  Normal ROM of hip, normal BLE strength, sensation, DTR, and SLR  VC obtained.  Left hip prepped with betadine and alcohol. Injected 27m of 1% lidocaine and 40 mg of depo-medrol into greater trochanteric bursa.  Pt tolerated procedure well. Dressed with band- aid  Assessment and Plan: Greater trochanteric bursitis of left hip - Plan: methylPREDNISolone acetate (DEPO-MEDROL) injection 40 mg  Insomnia, unspecified type - Plan: zolpidem (AMBIEN) 10 MG tablet  Treated as above for bursitis.  Asked her to let me know if treatment not helpful Refilled her ambien - 5 mg as needed prn sleep  Meds ordered this encounter  Medications  . zolpidem (AMBIEN) 10 MG tablet    Sig: Take 5 mg by mouth at bedtime as needed for insomnia    Dispense:  30 tablet    Refill:  1  . methylPREDNISolone acetate (DEPO-MEDROL) injection 40 mg     Signed JLamar Blinks MD

## 2016-05-01 NOTE — Patient Instructions (Addendum)
We did an injection to your hip today- I hope that it will help with your pain!  Let me know how this does for you  We also refilled your Lorrin Mais today- remember to use this as little as possible to decrease the risk of dependence, do not combine with alcohol or drive while on this medication   If you have any sign of infection in your hip- redness, etc please let me know right away

## 2016-05-20 ENCOUNTER — Ambulatory Visit (INDEPENDENT_AMBULATORY_CARE_PROVIDER_SITE_OTHER): Payer: Managed Care, Other (non HMO) | Admitting: Family Medicine

## 2016-05-20 VITALS — BP 130/80 | HR 78 | Temp 98.2°F | Wt 171.2 lb

## 2016-05-20 DIAGNOSIS — M79605 Pain in left leg: Secondary | ICD-10-CM | POA: Diagnosis not present

## 2016-05-20 MED ORDER — PREDNISONE 20 MG PO TABS: ORAL_TABLET | ORAL | 0 refills | Status: DC

## 2016-05-20 NOTE — Progress Notes (Signed)
[place] at [facility] [address] [place], [place] [address] [phone]  Date:  05/20/2016   Name:  Shirley Holder   DOB:  1971-04-09   MRN:  [id number]  PCP:  Lamar Blinks, MD    Chief Complaint: No chief complaint on file.   History of Present Illness:  Shirley Holder is a 46 y.o. very pleasant female patient who presents with the following:  Last visit on 05-01-2016 was for leg pain, HPI from this visit:  History of DCIS 2011, hep C /sp cure She has noted a pain in her left leg from the hip - it has been present for about one month.  Will wax and wane.    She does not have this issue in her right leg It feels like nerve pain- she will want to rub or shake the leg  She is generally ok with she is up and walking, but sitting or laying down can exacerbate her pain. It is especially bad at night.  She is taking aleve as needed  Trying to climb into her daughter's jeep can be painful for her.  No numbness in her leg, no difficulty with bowel or bladder control She does not generally have back pain but can noted some mild lower back ache at night  Her last mammo in May was normal- no recurrence of her breast cancer  She continues to use ambien as needed for her insomnia and needs a RF for next month Consulted NCCSR: no concerning findings.    She is not tender over her bony spine or left sciatic notch.  She is tender over the left greater trochanteric bursa.  Normal ROM of hip, normal BLE strength, sensation, DTR, and SLR  Plan from 05-01-2016 visit:  Greater trochanteric bursitis of left hip - Plan: methylPREDNISolone acetate (DEPO-MEDROL) injection 40 mg  (Left hip prepped with betadine and alcohol. Injected 31m of 1% lidocaine and 40 mg of depo-medrol into greater trochanteric bursa.  Pt tolerated procedure well. Dressed with band- aid)  Insomnia, unspecified type - Plan: zolpidem (AMBIEN) 10 MG tablet  Treated as  above for bursitis.  Asked her to let me know if treatment not helpful Refilled her ambien - 5 mg as needed prn sleep   HPI for today's visit:  Patient presenting with continuing pain in left leg. We did a steroid injection into her trochanteric bursa about 3 weeks ago.  The pain in her lateral hip is now better, but she conitnues to note a pain in her left shin and in the anterior hip/ groin. She describes the pain as "burning" and stinging.  She ruptured a disc in her neck some years ago and this reminds her of the pain she had into her arm at that time- feels 'like nerve pain."  She has tried aleve, and also tried some mobic- these helped her somewhat  She has used prednisone in the past for her neck issue and did ok with this medication  No fever, chills, belly pain No injury or change in routine that she can recall No rash on her body   Patient Active Problem List   Diagnosis Date Noted  . History of hepatitis C 02/01/2016  . Hyperlipidemia 09/21/2015  . 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 Osteopenia 09/21/2015  . Vitamin D deficiency 09/21/2015  . Hyperthyroidism 09/21/2015  . Insomnia 08/14/2015  . Anxiety state 08/14/2015  Past Medical History:  Diagnosis Date  . Cancer [facility])    Breast Cancer  . Hepatitis C   . Urinary tract infection     Past Surgical History:  Procedure Laterality Date  . BREAST BIOPSY  2011  . CHOLECYSTECTOMY  1991  . MASTECTOMY Left 2011  . NECK SURGERY  2007   Neck Fusion; Ruptured Disk  . NOVASURE ABLATION  11/2014  . TONSILLECTOMY  1988  . TUBAL LIGATION      Social History  Substance Use Topics  . Smoking status: Never Smoker  . Smokeless tobacco: Never Used  . Alcohol use 1.2 oz/week    2 Glasses of wine per week     Comment: Occasional    Family History  Problem Relation Age of Onset  . Heart disease Mother   . Heart disease Father   . Heart attack Father   . Diabetes Neg Hx     No  Known Allergies  Medication list has been reviewed and updated.  Current Outpatient Prescriptions on File Prior to Visit  Medication Sig Dispense Refill  . amoxicillin (AMOXIL) 500 MG capsule Take 2 capsules (1,000 mg total) by mouth 2 (two) times daily. (Patient not taking: Reported on 05/01/2016) 40 capsule 0  . clonazePAM (KLONOPIN) 0.5 MG tablet Take 1 tablet (0.5 mg total) by mouth daily. Use as needed for anxiety 30 tablet 0  . ibuprofen (ADVIL,MOTRIN) 200 MG tablet Take 200 mg by mouth as needed.    . meloxicam (MOBIC) 15 MG tablet Take 1 tablet (15 mg total) by mouth daily. Use as needed for foot pain (Patient not taking: Reported on 05/01/2016) 30 tablet 0  . zolpidem (AMBIEN) 10 MG tablet Take 5 mg by mouth at bedtime as needed for insomnia 30 tablet 1   No current facility-administered medications on file prior to visit.     Review of Systems:  As per HPI- otherwise negative.   Physical Examination: Vitals:   05/20/16 1126  BP: 130/80  Pulse: 78  Temp: 98.2 F (36.8 C)   Vitals:   05/20/16 1126  Weight: 171 lb 3.2 oz (77.7 kg)   Body mass index is 29.39 kg/m. Ideal Body Weight:    GEN: WDWN, NAD, Non-toxic, A & O x 3,mild overweight, looks well HEENT: Atraumatic, Normocephalic. Neck supple. No masses, No LAD.  Bilateral TM wnl, oropharynx normal.  PEERL,EOMI.   Ears and Nose: No external deformity. CV: RRR, No M/G/R. No JVD. No thrill. No extra heart sounds. PULM: CTA B, no wheezes, crackles, rhonchi. No retractions. No resp. distress. No accessory muscle use. ABD: S, NT, ND EXTR: No c/c/e NEURO Normal gait.  PSYCH: Normally interactive. Conversant. Not depressed or anxious appearing.  Calm demeanor.  Normal strength, sensation and DTR of both LE.  Negative SLR. No rash, heat, or other abnl of the skin. Normal ROM of the left hip.  I am not able to reproduce her pain on exam No tenderness of the lower back or the sciatic notch  Assessment and Plan: Left leg  pain - Plan: predniSONE (DELTASONE) 20 MG tablet  Here today with left leg pain radiating from the hip to the calf.  It feels like nerve pain- reminds pt of when she had a ruptured cervical disc. Will try a course of prednisone for her She will let me know if this is not helpful  Signed Lamar Blinks, MD

## 2016-05-20 NOTE — Patient Instructions (Signed)
Use the prednisone as directed for your hip pain. Remember while you are taking this avoid NSAID meds like mobic or aleve- tylenol is ok however  Please let me know if this is not helpful!

## 2016-06-19 ENCOUNTER — Ambulatory Visit: Payer: Self-pay | Admitting: Family Medicine

## 2016-09-03 NOTE — Progress Notes (Signed)
[facility] at [facility] [address] [place], [place] [address] [phone]  Date:  09/04/2016   Name:  Shirley Holder   DOB:  1970/07/27   MRN:  [id number]  PCP:  Darreld Mclean, MD    Chief Complaint: Annual Exam   History of Present Illness:  Shirley Holder is a 46 y.o. very pleasant female patient who presents with the following:  Seen most recently in January with hip pain. Here today for a CPE Pap 2015- will update for her today.  Never had an abnl in the past that she can recall She did have novosure about 2 years ago per Dr. Shelly Flatten for menorrhagia.  Did well for a long time, but her last couple of menses were painful and heavy again.   History of breast cancer > 5 years ago, she is now just on screening mammogram annually History of hep C- cured Most recent complete labs about a year ago- will plan to get for her today  NCCSR: she was getting phentermine from a Dr. Richard Miu- however she just used this for one month and decided to stop  Filled ambien from me on 4/16. She takes 4 mg  She last ate about 4 hours ago  She will schedule her mammo asap as it is due  She very rarely uses the klonopin- last took months ago She uses ambien as needed for insomnia, maybe 4 nights of the week Never a smoker, rare alcohol   Wt Readings from Last 3 Encounters:  09/04/16 165 lb (74.8 kg)  05/20/16 171 lb 3.2 oz (77.7 kg)  05/01/16 169 lb 3.2 oz (76.7 kg)      Patient Active Problem List   Diagnosis Date Noted  . History of hepatitis C 02/01/2016  . Hyperlipidemia 09/21/2015  . 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 Osteopenia 09/21/2015  . Vitamin D deficiency 09/21/2015  . Hyperthyroidism 09/21/2015  . Insomnia 08/14/2015  . Anxiety state 08/14/2015    Past Medical History:  Diagnosis Date  . Cancer [facility])    Breast Cancer  . Hepatitis C   . Urinary tract infection     Past Surgical  History:  Procedure Laterality Date  . BREAST BIOPSY  2011  . CHOLECYSTECTOMY  1991  . MASTECTOMY Left 2011  . NECK SURGERY  2007   Neck Fusion; Ruptured Disk  . NOVASURE ABLATION  11/2014  . TONSILLECTOMY  1988  . TUBAL LIGATION      Social History  Substance Use Topics  . Smoking status: Never Smoker  . Smokeless tobacco: Never Used  . Alcohol use 1.2 oz/week    2 Glasses of wine per week     Comment: Occasional    Family History  Problem Relation Age of Onset  . Heart disease Mother   . Heart disease Father   . Heart attack Father   . Diabetes Neg Hx     No Known Allergies  Medication list has been reviewed and updated.  Current Outpatient Prescriptions on File Prior to Visit  Medication Sig Dispense Refill  . clonazePAM (KLONOPIN) 0.5 MG tablet Take 1 tablet (0.5 mg total) by mouth daily. Use as needed for anxiety 30 tablet 0  . zolpidem (AMBIEN) 10 MG tablet Take 5 mg by mouth at bedtime as needed for insomnia 30 tablet 1  . ibuprofen (ADVIL,MOTRIN) 200 MG tablet Take 200 mg by mouth as needed.    Marland Kitchen  meloxicam (MOBIC) 15 MG tablet Take 1 tablet (15 mg total) by mouth daily. Use as needed for foot pain 30 tablet 0   No current facility-administered medications on file prior to visit.     Review of Systems:  As per HPI- otherwise negative.   Physical Examination: Vitals:   09/04/16 1508  BP: 131/81  Pulse: 83  Temp: 99.2 F (37.3 C)   Vitals:   09/04/16 1508  Weight: 165 lb (74.8 kg)  Height: 5' 4"  (1.626 m)   Body mass index is 28.32 kg/m. Ideal Body Weight: Weight in (lb) to have BMI = 25: 145.3  GEN: WDWN, NAD, Non-toxic, A & O x 3, overweight, looks well HEENT: Atraumatic, Normocephalic. Neck supple. No masses, No LAD. Bilateral TM wnl, oropharynx normal.  PEERL,EOMI.   Ears and Nose: No external deformity. CV: RRR, No M/G/R. No JVD. No thrill. No extra heart sounds. PULM: CTA B, no wheezes, crackles, rhonchi. No retractions. No resp.  distress. No accessory muscle use. ABD: S, NT, ND, +BS. No rebound. No HSM. EXTR: No c/c/e NEURO Normal gait.  PSYCH: Normally interactive. Conversant. Not depressed or anxious appearing.  Calm demeanor.  Breast: normal exam for pt- left has an implant and right is s/p reduction, no masses/ dimpling/ discharge Pelvic: normal, no vaginal lesions or discharge. Uterus normal, no CMT, no adnexal tendereness or masses Scant dark blood drom cervix  Assessment and Plan: Physical exam  Screening for cervical cancer - Plan: Cytology - PAP  Screening for diabetes mellitus - Plan: Comprehensive metabolic panel, Hemoglobin A1c  Screening for hyperlipidemia - Plan: Lipid panel  Screening for thyroid disorder - Plan: TSH  Screening for deficiency anemia - Plan: CBC  History of vitamin D deficiency - Plan: Vitamin D (25 hydroxy)  Insomnia, unspecified type - Plan: zolpidem (AMBIEN) 10 MG tablet  Here today for her CPE Pap and labs pending as above Her menses have become heavy again- she prefers to observe for now but will let me know if she wants to see GYN for a repeat ablation  Will plan further follow- up pending labs. She does use mychart b  Signed Lamar Blinks, MD

## 2016-09-04 ENCOUNTER — Other Ambulatory Visit (HOSPITAL_COMMUNITY)
Admission: RE | Admit: 2016-09-04 | Discharge: 2016-09-04 | Disposition: A | Discharge status: Home / Self Care | Payer: Managed Care, Other (non HMO) | Source: Ambulatory Visit | Attending: Family Medicine | Admitting: Family Medicine

## 2016-09-04 ENCOUNTER — Ambulatory Visit (INDEPENDENT_AMBULATORY_CARE_PROVIDER_SITE_OTHER): Payer: Managed Care, Other (non HMO) | Admitting: Family Medicine

## 2016-09-04 ENCOUNTER — Encounter: Payer: Self-pay | Admitting: Family Medicine

## 2016-09-04 VITALS — BP 131/81 | HR 83 | Temp 99.2°F | Ht 64.0 in | Wt 165.0 lb

## 2016-09-04 DIAGNOSIS — Z8639 Personal history of other endocrine, nutritional and metabolic disease: Secondary | ICD-10-CM

## 2016-09-04 DIAGNOSIS — Z1329 Encounter for screening for other suspected endocrine disorder: Secondary | ICD-10-CM | POA: Diagnosis not present

## 2016-09-04 DIAGNOSIS — Z0000 Encounter for general adult medical examination without abnormal findings: Secondary | ICD-10-CM

## 2016-09-04 DIAGNOSIS — Z13220 Encounter for screening for lipoid disorders: Secondary | ICD-10-CM

## 2016-09-04 DIAGNOSIS — Z131 Encounter for screening for diabetes mellitus: Secondary | ICD-10-CM | POA: Diagnosis not present

## 2016-09-04 DIAGNOSIS — Z124 Encounter for screening for malignant neoplasm of cervix: Secondary | ICD-10-CM | POA: Diagnosis not present

## 2016-09-04 DIAGNOSIS — Z130 Encounter for screening for diseases of the blood and blood-forming organs and certain disorders involving the immune mechanism: Secondary | ICD-10-CM

## 2016-09-04 DIAGNOSIS — G4700 Insomnia, unspecified: Secondary | ICD-10-CM

## 2016-09-04 MED ORDER — ZOLPIDEM TARTRATE 10 MG PO TABS
ORAL_TABLET | ORAL | 3 refills | Status: DC
Start: 2016-09-04 — End: 2017-03-06

## 2016-09-04 NOTE — Patient Instructions (Signed)
It was very nice to see you today!  I will be in touch with your labs and pap asap  Please let me know if you need me to set you up with GYN to discuss a repeat ablation for heavy menstrual bleeding   Health Maintenance, Female Adopting a healthy lifestyle and getting preventive care can go a long way to promote health and wellness. Talk with your health care provider about what schedule of regular examinations is right for you. This is a good chance for you to check in with your provider about disease prevention and staying healthy. In between checkups, there are plenty of things you can do on your own. Experts have done a lot of research about which lifestyle changes and preventive measures are most likely to keep you healthy. Ask your health care provider for more information. Weight and diet Eat a healthy diet  Be sure to include plenty of vegetables, fruits, low-fat dairy products, and lean protein.  Do not eat a lot of foods high in solid fats, added sugars, or salt.  Get regular exercise. This is one of the most important things you can do for your health.  Most adults should exercise for at least 150 minutes each week. The exercise should increase your heart rate and make you sweat (moderate-intensity exercise).  Most adults should also do strengthening exercises at least twice a week. This is in addition to the moderate-intensity exercise. Maintain a healthy weight  Body mass index (BMI) is a measurement that can be used to identify possible weight problems. It estimates body fat based on height and weight. Your health care provider can help determine your BMI and help you achieve or maintain a healthy weight.  For females 34 years of age and older:  A BMI below 18.5 is considered underweight.  A BMI of 18.5 to 24.9 is normal.  A BMI of 25 to 29.9 is considered overweight.  A BMI of 30 and above is considered obese. Watch levels of cholesterol and blood lipids  You should  start having your blood tested for lipids and cholesterol at 47 years of age, then have this test every 5 years.  You may need to have your cholesterol levels checked more often if:  Your lipid or cholesterol levels are high.  You are older than 47 years of age.  You are at high risk for heart disease. Cancer screening Lung Cancer  Lung cancer screening is recommended for adults 99-47 years old who are at high risk for lung cancer because of a history of smoking.  A yearly low-dose CT scan of the lungs is recommended for people who:  Currently smoke.  Have quit within the past 15 years.  Have at least a 30-pack-year history of smoking. A pack year is smoking an average of one pack of cigarettes a day for 1 year.  Yearly screening should continue until it has been 15 years since you quit.  Yearly screening should stop if you develop a health problem that would prevent you from having lung cancer treatment. Breast Cancer  Practice breast self-awareness. This means understanding how your breasts normally appear and feel.  It also means doing regular breast self-exams. Let your health care provider know about any changes, no matter how small.  If you are in your 20s or 30s, you should have a clinical breast exam (CBE) by a health care provider every 1-3 years as part of a regular health exam.  If you are 40  or older, have a CBE every year. Also consider having a breast X-ray (mammogram) every year.  If you have a family history of breast cancer, talk to your health care provider about genetic screening.  If you are at high risk for breast cancer, talk to your health care provider about having an MRI and a mammogram every year.  Breast cancer gene (BRCA) assessment is recommended for women who have family members with BRCA-related cancers. BRCA-related cancers include:  Breast.  Ovarian.  Tubal.  Peritoneal cancers.  Results of the assessment will determine the need for  genetic counseling and BRCA1 and BRCA2 testing. Cervical Cancer  Your health care provider may recommend that you be screened regularly for cancer of the pelvic organs (ovaries, uterus, and vagina). This screening involves a pelvic examination, including checking for microscopic changes to the surface of your cervix (Pap test). You may be encouraged to have this screening done every 3 years, beginning at age 74.  For women ages 46-65, health care providers may recommend pelvic exams and Pap testing every 3 years, or they may recommend the Pap and pelvic exam, combined with testing for human papilloma virus (HPV), every 5 years. Some types of HPV increase your risk of cervical cancer. Testing for HPV may also be done on women of any age with unclear Pap test results.  Other health care providers may not recommend any screening for nonpregnant women who are considered low risk for pelvic cancer and who do not have symptoms. Ask your health care provider if a screening pelvic exam is right for you.  If you have had past treatment for cervical cancer or a condition that could lead to cancer, you need Pap tests and screening for cancer for at least 20 years after your treatment. If Pap tests have been discontinued, your risk factors (such as having a new sexual partner) need to be reassessed to determine if screening should resume. Some women have medical problems that increase the chance of getting cervical cancer. In these cases, your health care provider may recommend more frequent screening and Pap tests. Colorectal Cancer  This type of cancer can be detected and often prevented.  Routine colorectal cancer screening usually begins at 47 years of age and continues through 47 years of age.  Your health care provider may recommend screening at an earlier age if you have risk factors for colon cancer.  Your health care provider may also recommend using home test kits to check for hidden blood in the  stool.  A small camera at the end of a tube can be used to examine your colon directly (sigmoidoscopy or colonoscopy). This is done to check for the earliest forms of colorectal cancer.  Routine screening usually begins at age 40.  Direct examination of the colon should be repeated every 5-10 years through 47 years of age. However, you may need to be screened more often if early forms of precancerous polyps or small growths are found. Skin Cancer  Check your skin from head to toe regularly.  Tell your health care provider about any new moles or changes in moles, especially if there is a change in a mole's shape or color.  Also tell your health care provider if you have a mole that is larger than the size of a pencil eraser.  Always use sunscreen. Apply sunscreen liberally and repeatedly throughout the day.  Protect yourself by wearing long sleeves, pants, a wide-brimmed hat, and sunglasses whenever you are outside. Heart  disease, diabetes, and high blood pressure  High blood pressure causes heart disease and increases the risk of stroke. High blood pressure is more likely to develop in:  People who have blood pressure in the high end of the normal range (130-139/85-89 mm Hg).  People who are overweight or obese.  People who are African American.  If you are 33-44 years of age, have your blood pressure checked every 3-5 years. If you are 75 years of age or older, have your blood pressure checked every year. You should have your blood pressure measured twice-once when you are at a hospital or clinic, and once when you are not at a hospital or clinic. Record the average of the two measurements. To check your blood pressure when you are not at a hospital or clinic, you can use:  An automated blood pressure machine at a pharmacy.  A home blood pressure monitor.  If you are between 61 years and 43 years old, ask your health care provider if you should take aspirin to prevent  strokes.  Have regular diabetes screenings. This involves taking a blood sample to check your fasting blood sugar level.  If you are at a normal weight and have a low risk for diabetes, have this test once every three years after 47 years of age.  If you are overweight and have a high risk for diabetes, consider being tested at a younger age or more often. Preventing infection Hepatitis B  If you have a higher risk for hepatitis B, you should be screened for this virus. You are considered at high risk for hepatitis B if:  You were born in a country where hepatitis B is common. Ask your health care provider which countries are considered high risk.  Your parents were born in a high-risk country, and you have not been immunized against hepatitis B (hepatitis B vaccine).  You have HIV or AIDS.  You use needles to inject street drugs.  You live with someone who has hepatitis B.  You have had sex with someone who has hepatitis B.  You get hemodialysis treatment.  You take certain medicines for conditions, including cancer, organ transplantation, and autoimmune conditions. Hepatitis C  Blood testing is recommended for:  Everyone born from [birth year] through 1966.  Anyone with known risk factors for hepatitis C. Sexually transmitted infections (STIs)  You should be screened for sexually transmitted infections (STIs) including gonorrhea and chlamydia if:  You are sexually active and are younger than 47 years of age.  You are older than 47 years of age and your health care provider tells you that you are at risk for this type of infection.  Your sexual activity has changed since you were last screened and you are at an increased risk for chlamydia or gonorrhea. Ask your health care provider if you are at risk.  If you do not have HIV, but are at risk, it may be recommended that you take a prescription medicine daily to prevent HIV infection. This is called pre-exposure prophylaxis  (PrEP). You are considered at risk if:  You are sexually active and do not regularly use condoms or know the HIV status of your partner(s).  You take drugs by injection.  You are sexually active with a partner who has HIV. Talk with your health care provider about whether you are at high risk of being infected with HIV. If you choose to begin PrEP, you should first be tested for HIV. You should then be  tested every 3 months for as long as you are taking PrEP. Pregnancy  If you are premenopausal and you may become pregnant, ask your health care provider about preconception counseling.  If you may become pregnant, take 400 to 800 micrograms (mcg) of folic acid every day.  If you want to prevent pregnancy, talk to your health care provider about birth control (contraception). Osteoporosis and menopause  Osteoporosis is a disease in which the bones lose minerals and strength with aging. This can result in serious bone fractures. Your risk for osteoporosis can be identified using a bone density scan.  If you are 38 years of age or older, or if you are at risk for osteoporosis and fractures, ask your health care provider if you should be screened.  Ask your health care provider whether you should take a calcium or vitamin D supplement to lower your risk for osteoporosis.  Menopause may have certain physical symptoms and risks.  Hormone replacement therapy may reduce some of these symptoms and risks. Talk to your health care provider about whether hormone replacement therapy is right for you. Follow these instructions at home:  Schedule regular health, dental, and eye exams.  Stay current with your immunizations.  Do not use any tobacco products including cigarettes, chewing tobacco, or electronic cigarettes.  If you are pregnant, do not drink alcohol.  If you are breastfeeding, limit how much and how often you drink alcohol.  Limit alcohol intake to no more than 1 drink per day for  nonpregnant women. One drink equals 12 ounces of beer, 5 ounces of wine, or 1 ounces of hard liquor.  Do not use street drugs.  Do not share needles.  Ask your health care provider for help if you need support or information about quitting drugs.  Tell your health care provider if you often feel depressed.  Tell your health care provider if you have ever been abused or do not feel safe at home. This information is not intended to replace advice given to you by your health care provider. Make sure you discuss any questions you have with your health care provider. Document Released: 10/22/2010 Document Revised: 09/14/2015 Document Reviewed: 01/10/2015 Elsevier Interactive Patient Education  2017 [employer].

## 2016-09-05 ENCOUNTER — Encounter: Payer: Self-pay | Admitting: Family Medicine

## 2016-09-05 LAB — LIPID PANEL
Cholesterol: 166 mg/dL (ref 0–200)
HDL: 54.3 mg/dL (ref >39.00)
LDL Cholesterol: 97 mg/dL (ref 0–99)
NONHDL: 112.06
Total CHOL/HDL Ratio: 3
Triglycerides: 74 mg/dL (ref 0.0–149.0)
VLDL: 14.8 mg/dL (ref 0.0–40.0)

## 2016-09-05 LAB — CBC
HEMATOCRIT: 41.4 % (ref 36.0–46.0)
HEMOGLOBIN: 13.8 g/dL (ref 12.0–15.0)
MCHC: 33.4 g/dL (ref 30.0–36.0)
MCV: 88.2 fl (ref 78.0–100.0)
PLATELETS: 231 10*3/uL (ref 150.0–400.0)
RBC: 4.7 Mil/uL (ref 3.87–5.11)
RDW: 13.3 % (ref 11.5–15.5)
WBC: 7 10*3/uL (ref 4.0–10.5)

## 2016-09-05 LAB — COMPREHENSIVE METABOLIC PANEL
ALK PHOS: 37 U/L — AB (ref 39–117)
ALT: 11 U/L (ref 0–35)
AST: 16 U/L (ref 0–37)
Albumin: 4.2 g/dL (ref 3.5–5.2)
BUN: 14 mg/dL (ref 6–23)
CALCIUM: 9.5 mg/dL (ref 8.4–10.5)
CO2: 31 meq/L (ref 19–32)
Chloride: 104 mEq/L (ref 96–112)
Creatinine, Ser: 0.87 mg/dL (ref 0.40–1.20)
GFR: 74.28 mL/min (ref >60.00)
Glucose, Bld: 81 mg/dL (ref 70–99)
POTASSIUM: 3.9 meq/L (ref 3.5–5.1)
Sodium: 140 mEq/L (ref 135–145)
Total Bilirubin: 0.4 mg/dL (ref 0.2–1.2)
Total Protein: 7.4 g/dL (ref 6.0–8.3)

## 2016-09-05 LAB — HEMOGLOBIN A1C: HEMOGLOBIN A1C: 5.1 % (ref 4.6–6.5)

## 2016-09-05 LAB — VITAMIN D 25 HYDROXY (VIT D DEFICIENCY, FRACTURES): VITD: 59.85 ng/mL (ref 30.00–100.00)

## 2016-09-05 LAB — TSH: TSH: 1.46 u[IU]/mL (ref 0.35–4.50)

## 2016-09-06 LAB — CYTOLOGY - PAP
DIAGNOSIS: NEGATIVE
HPV: NOT DETECTED

## 2017-02-04 NOTE — Progress Notes (Signed)
[place] at [facility] [address], [place], [place] [address] [phone]  Date:  02/05/2017   Name:  Shirley Holder   DOB:  1971-01-16   MRN:  [id number]  PCP:  Darreld Mclean, MD    Chief Complaint: Hip Pain (c/o ongoing pain in the left hip and leg that radiates down to toe. Declined flu vaccine. )   History of Present Illness:  Shirley Holder is a 46 y.o. very pleasant female patient who presents with the following:  I last saw her in May of this year for a routine follow-up visit:  Seen most recently in January with hip pain. Here today for a CPE Pap 2015- will update for her today.  Never had an abnl in the past that she can recall She did have novosure about 2 years ago per Dr. Shelly Flatten for menorrhagia.  Did well for a long time, but her last couple of menses were painful and heavy again.   History of breast cancer > 5 years ago, she is now just on screening mammogram annually History of hep C- cured Most recent complete labs about a year ago- will plan to get for her today  NCCSR: she was getting phentermine from a Dr. Richard Miu- however she just used this for one month and decided to stop  Filled ambien from me on 4/16. She takes 5 mg  She last ate about 4 hours ago  She will schedule her mammo asap as it is due  She very rarely uses the klonopin- last took months ago She uses ambien as needed for insomnia, maybe 4 nights of the week Never a smoker, rare alcohol   She notes that her hip is still bothering her a lot. It feels like her leg is weaker than the right when she tries to lift the leg  She will feel "like an electric shock" going down to her big toe at times The pain has become much more persistent over the last few weeks It will bother her more if she walks a lot or sits too long- she has to keep changing positions to stay comfortable She has pain when she drives She does not feel like she has any  numbness, but does have a "nerve pain" feeling with a shooting zing down the leg She is using tylenol and some motrin 800 that her husband had  She tried some lidocaine patches which did help a bit for a while  No fever, chills or rashes She notes an ache in her left lower back but this is not the main feature  No bowel or bladder control concerns She had a ruptured cervical disc in the past and did have surgery for this   She uses ambien a few days a week- has been using recently as her back pain keeps her up at night  Patient Active Problem List   Diagnosis Date Noted  . History of hepatitis C 02/01/2016  . Hyperlipidemia 09/21/2015  . 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 Osteopenia 09/21/2015  . Vitamin D deficiency 09/21/2015  . Hyperthyroidism 09/21/2015  . Insomnia 08/14/2015  . Anxiety state 08/14/2015    Past Medical History:  Diagnosis Date  . Cancer [facility])    Breast Cancer  . Hepatitis C   . Urinary tract infection     Past Surgical History:  Procedure Laterality Date  . BREAST BIOPSY  2011  .  CHOLECYSTECTOMY  1991  . MASTECTOMY Left 2011  . NECK SURGERY  2007   Neck Fusion; Ruptured Disk  . NOVASURE ABLATION  11/2014  . TONSILLECTOMY  1988  . TUBAL LIGATION      Social History  Substance Use Topics  . Smoking status: Never Smoker  . Smokeless tobacco: Never Used  . Alcohol use 1.2 oz/week    2 Glasses of wine per week     Comment: Occasional    Family History  Problem Relation Age of Onset  . Heart disease Mother   . Heart disease Father   . Heart attack Father   . Diabetes Neg Hx     No Known Allergies  Medication list has been reviewed and updated.  Current Outpatient Prescriptions on File Prior to Visit  Medication Sig Dispense Refill  . clonazePAM (KLONOPIN) 0.5 MG tablet Take 1 tablet (0.5 mg total) by mouth daily. Use as needed for anxiety 30 tablet 0  . ibuprofen (ADVIL,MOTRIN) 200 MG tablet Take 200 mg  by mouth as needed.    . zolpidem (AMBIEN) 10 MG tablet Take 5 mg by mouth at bedtime as needed for insomnia 30 tablet 3   No current facility-administered medications on file prior to visit.     Review of Systems:  As per HPI- otherwise negative. LMP 01/08/17- no current suspicion of pregnancy No abd pain No vomiting or diarrhea No rash    Physical Examination: Vitals:   02/05/17 1132  BP: 140/75  Pulse: 74  Temp: 98.3 F (36.8 C)  SpO2: 100%   Vitals:   02/05/17 1132  Weight: 154 lb 6.4 oz (70 kg)   Body mass index is 26.5 kg/m. Ideal Body Weight:    GEN: WDWN, NAD, Non-toxic, A & O x 3, looks well, normal weight HEENT: Atraumatic, Normocephalic. Neck supple. No masses, No LAD. Ears and Nose: No external deformity. CV: RRR, No M/G/R. No JVD. No thrill. No extra heart sounds. PULM: CTA B, no wheezes, crackles, rhonchi. No retractions. No resp. distress. No accessory muscle use. ABD: S, NT, ND, benign belly EXTR: No c/c/e NEURO Normal gait.  PSYCH: Normally interactive. Conversant. Not depressed or anxious appearing.  Calm demeanor.  Left leg: compared to the right, she has decreased hamstring and quad strength. Slightly positive SLR on the left only Normal patellar and ankle DTR No rash Normal sensation to palpation both legs  She notes a mild tenderness to pressure over the left sciatic notch/ buttock but otherwise no spine tenderness   Dg Lumbar Spine Complete  Result Date: 02/05/2017 CLINICAL DATA:  Chronic lower back pain without known injury. EXAM: LUMBAR SPINE - COMPLETE 4+ VIEW COMPARISON:  None. FINDINGS: No fracture or spondylolisthesis is noted. Disk spaces are well-maintained. Posterior facet joints appear unremarkable. IMPRESSION: Normal lumbar spine. Electronically Signed   By: Marijo Conception, M.D.   On: 02/05/2017 12:25   Dg Hip Unilat W Or W/o Pelvis 2-3 Views Left  Result Date: 02/05/2017 CLINICAL DATA:  Chronic left hip pain without known  injury. EXAM: DG HIP (WITH OR WITHOUT PELVIS) 2-3V LEFT COMPARISON:  None. FINDINGS: There is no evidence of hip fracture or dislocation. There is no evidence of arthropathy or other focal bone abnormality. IMPRESSION: Normal left hip. Electronically Signed   By: Marijo Conception, M.D.   On: 02/05/2017 12:24     Assessment and Plan: Left hip pain - Plan: DG HIP UNILAT W OR W/O PELVIS 2-3 VIEWS LEFT, DG Lumbar  Spine Complete, predniSONE (DELTASONE) 20 MG tablet, traMADol (ULTRAM) 50 MG tablet  Left leg paresthesias - Plan: DG HIP UNILAT W OR W/O PELVIS 2-3 VIEWS LEFT, DG Lumbar Spine Complete, predniSONE (DELTASONE) 20 MG tablet  Here today with persistent/ worsening pain down the left leg.  Suspect a pinched nerve Will treat with prednisone as below She cannot use nsaids while on pred and tylenol does not control her pain- rx for tramadol.  Cautioned regarding sedation with this medication She will let me know if her sx are not resolved with the prednisone- in that case consider an MRI   Signed Lamar Blinks, MD

## 2017-02-05 ENCOUNTER — Ambulatory Visit (INDEPENDENT_AMBULATORY_CARE_PROVIDER_SITE_OTHER): Payer: Managed Care, Other (non HMO) | Admitting: Family Medicine

## 2017-02-05 ENCOUNTER — Encounter: Payer: Self-pay | Admitting: Family Medicine

## 2017-02-05 ENCOUNTER — Ambulatory Visit (HOSPITAL_BASED_OUTPATIENT_CLINIC_OR_DEPARTMENT_OTHER)
Admission: RE | Admit: 2017-02-05 | Discharge: 2017-02-05 | Disposition: A | Discharge status: Home / Self Care | Payer: Managed Care, Other (non HMO) | Source: Ambulatory Visit | Attending: Family Medicine | Admitting: Family Medicine

## 2017-02-05 VITALS — BP 140/75 | HR 74 | Temp 98.3°F | Wt 154.4 lb

## 2017-02-05 DIAGNOSIS — M25552 Pain in left hip: Secondary | ICD-10-CM | POA: Diagnosis not present

## 2017-02-05 DIAGNOSIS — R202 Paresthesia of skin: Secondary | ICD-10-CM | POA: Diagnosis not present

## 2017-02-05 DIAGNOSIS — M545 Low back pain: Secondary | ICD-10-CM | POA: Insufficient documentation

## 2017-02-05 MED ORDER — TRAMADOL HCL 50 MG PO TABS: 50.0000 mg | ORAL_TABLET | Freq: Four times a day (QID) | ORAL | 0 refills | Status: DC | PRN

## 2017-02-05 MED ORDER — PREDNISONE 20 MG PO TABS: ORAL_TABLET | ORAL | 0 refills | Status: DC

## 2017-02-05 NOTE — Patient Instructions (Signed)
It was good to see you today- I am sorry that your leg is being so bothersome!  I agree that you likely have a pinched nerve We will try prednisone for 10 days- please let me know how this does for you. If not helpful we can move forward with an MRI I also gave you tramadol to use as needed for pain.  Remember this can make you feel drowsy- do not use with ambien While you are on prednisone avoid NSAID medications such as ibuprofen

## 2017-03-06 ENCOUNTER — Encounter: Payer: Self-pay | Admitting: Family Medicine

## 2017-03-06 ENCOUNTER — Other Ambulatory Visit: Payer: Self-pay | Admitting: Family Medicine

## 2017-03-06 DIAGNOSIS — G4700 Insomnia, unspecified: Secondary | ICD-10-CM

## 2017-03-06 MED ORDER — ZOLPIDEM TARTRATE 10 MG PO TABS: ORAL_TABLET | ORAL | 2 refills | Status: DC

## 2017-06-07 IMAGING — CR DG FOOT COMPLETE 3+V*R*
3 series · 3 of 3 positions shown · non-contrast
Comparison: None.

CLINICAL DATA: Two-month history of hindfoot pain

EXAM:
RIGHT FOOT COMPLETE - 3+ VIEW

[t foot ap right]
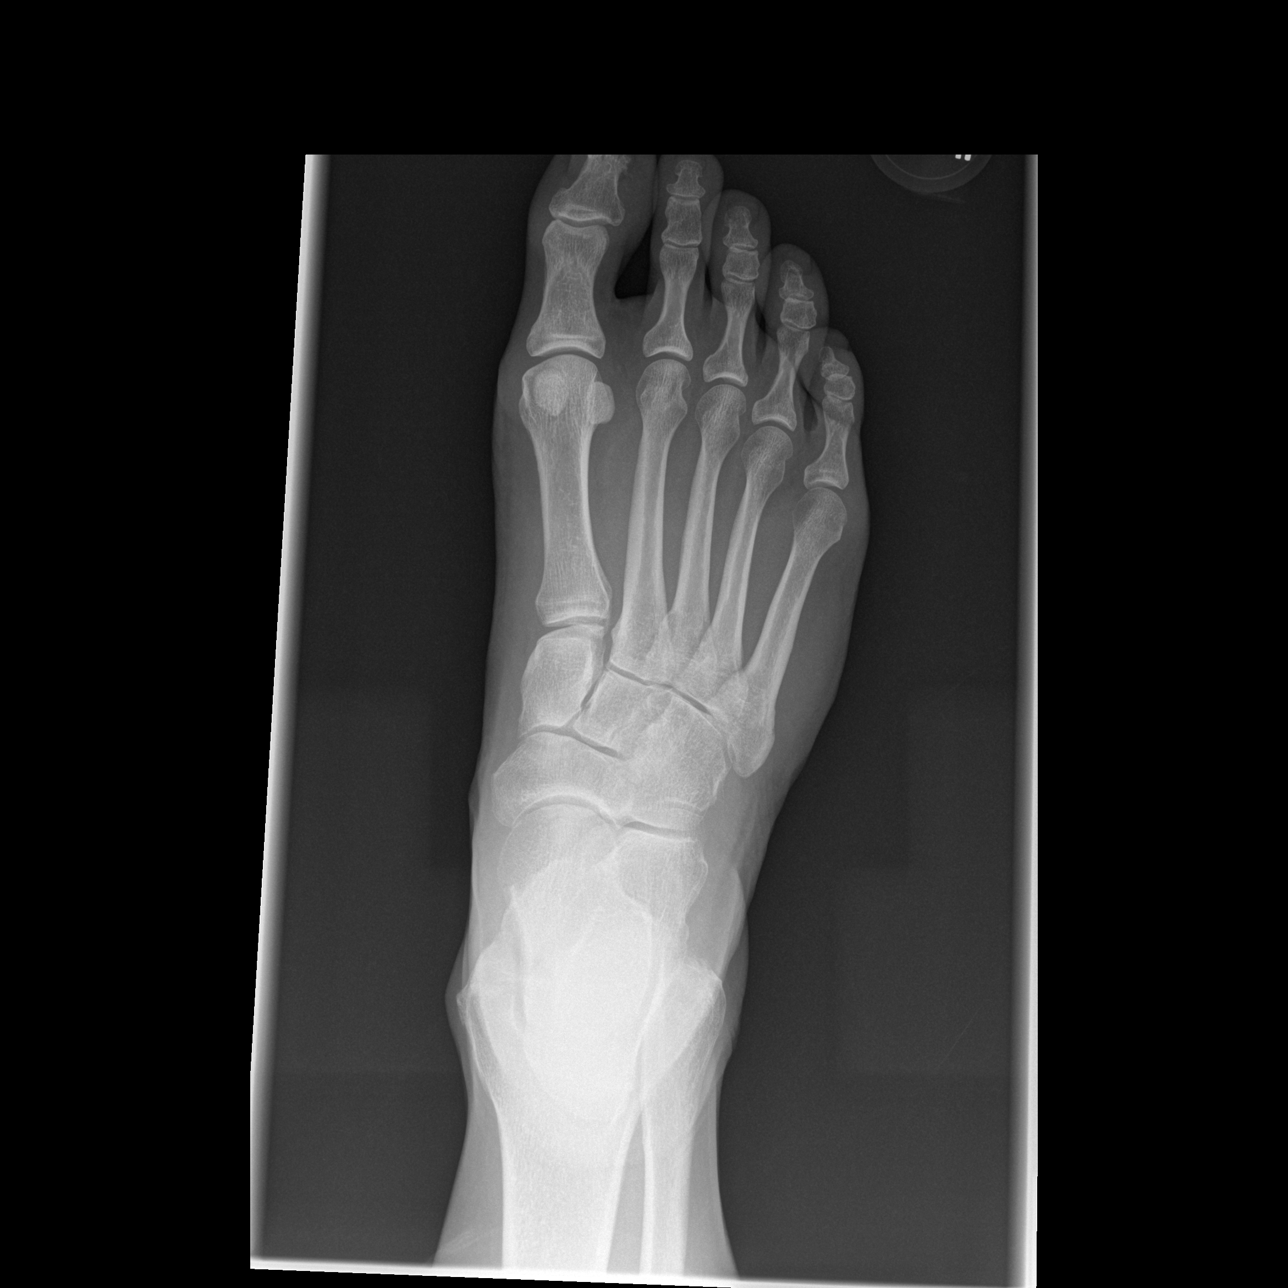

[t foot oblique right]
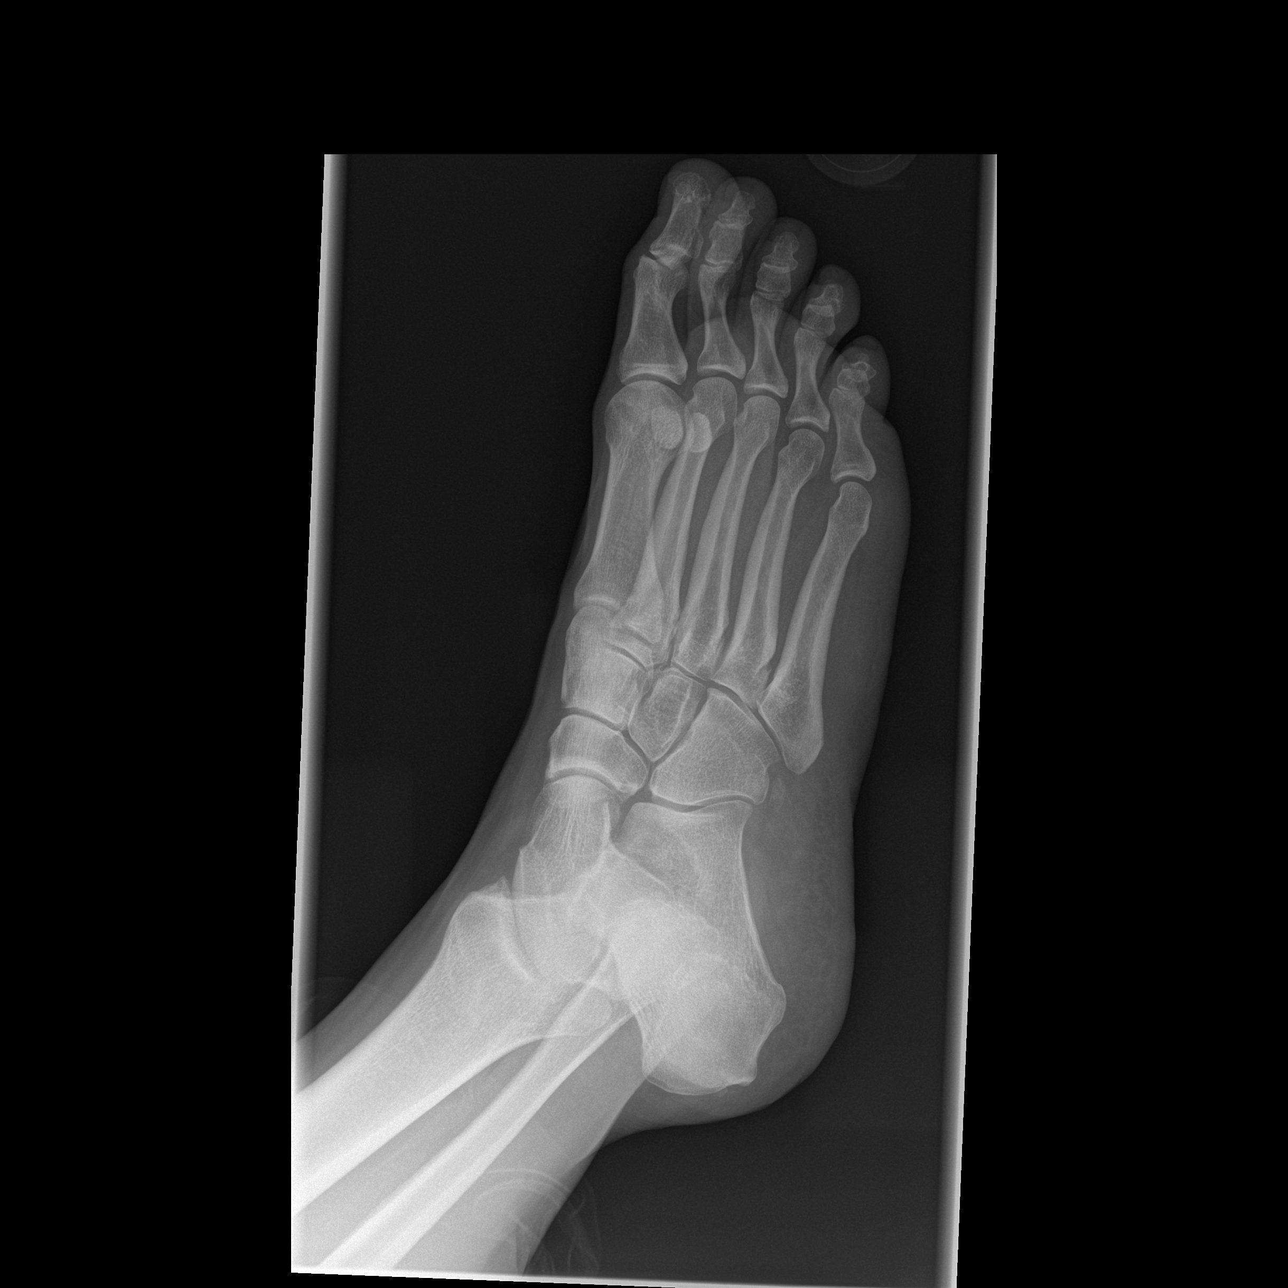

[t foot lat right]
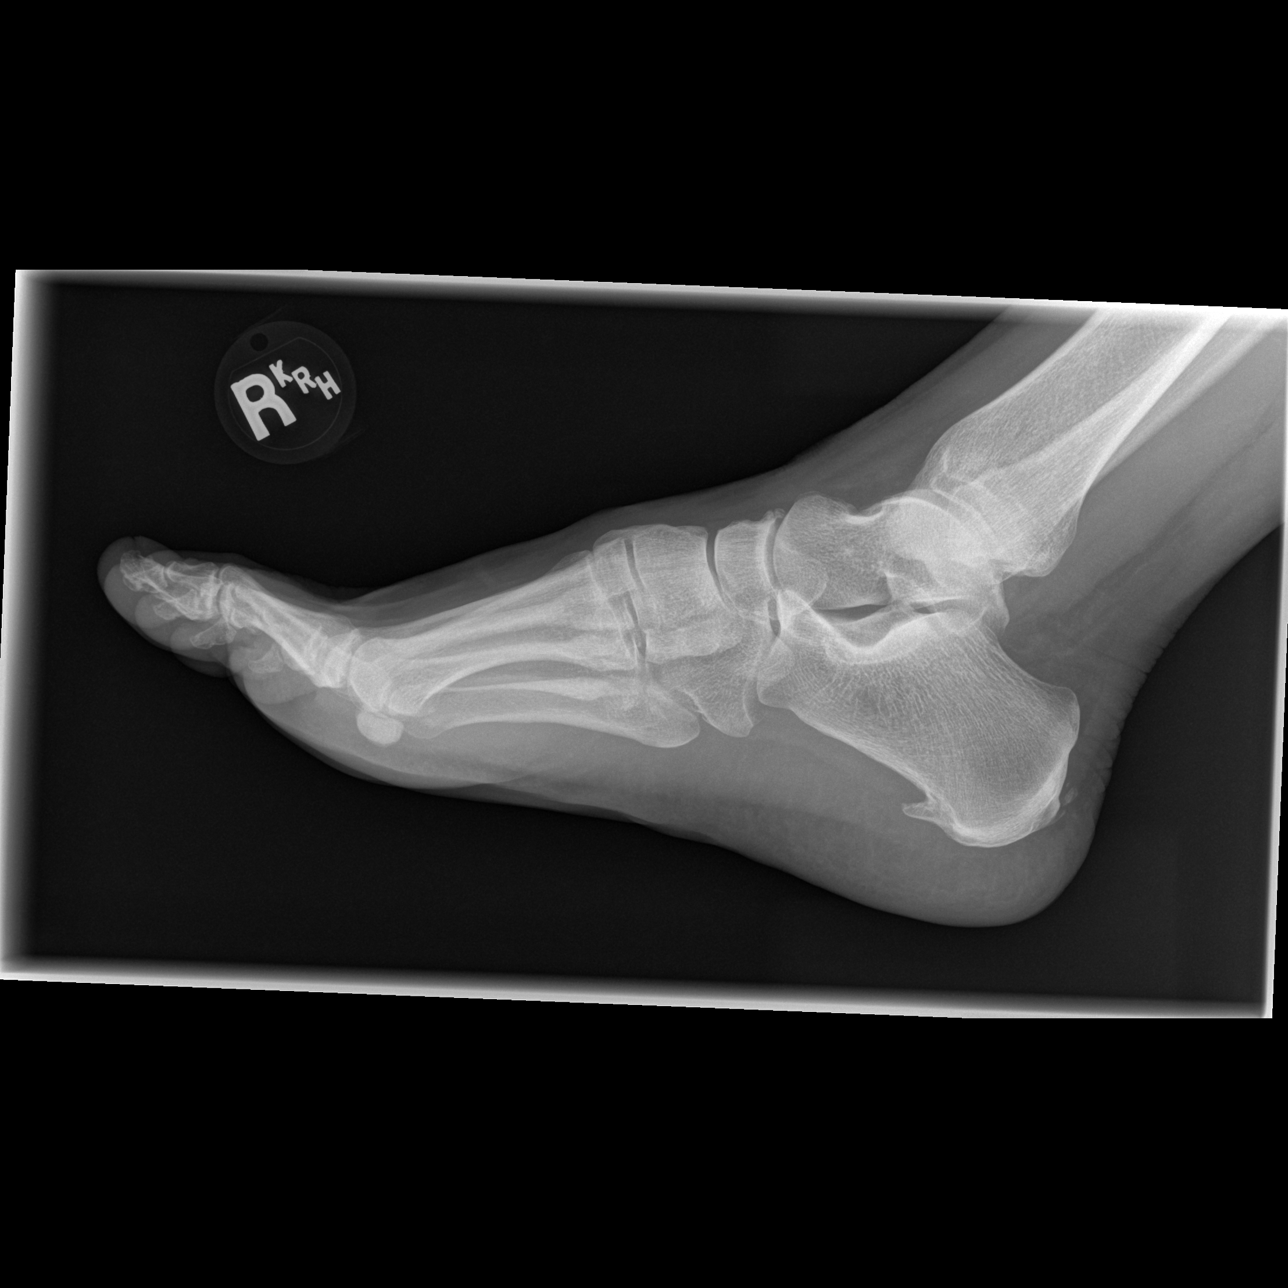

[3 of 3 positions shown; findings below may reference images not displayed]

FINDINGS: Frontal, oblique, and lateral views were obtained. There is no acute
fracture or dislocation. There is no appreciable joint space
narrowing. There is a spur along the proximal dorsal navicular.
There are inferior and posterior calcaneal spurs. No erosive change.
IMPRESSION: Calcaneal spurs. Mild osteoarthritic change in the dorsal midfoot.
No fracture or dislocation. No erosive change.

## 2017-09-08 NOTE — Progress Notes (Addendum)
[place] at [facility] [place], [place], [place] [address] [phone]  Date:  09/10/2017   Name:  Shirley Holder   DOB:  [birth year]   MRN:  [id number]  PCP:  Darreld Mclean, MD    Chief Complaint: Annual Exam (up to date on PAP) and Sciatica (left leg pain)   History of Present Illness:  Shirley Holder is a 47 y.o. very pleasant female patient who presents with the following:  Here today for a CPE Per last CPE one year ago:  She did have novosure about 2 years ago per Dr. Shelly Flatten for menorrhagia.  Did well for a long time, but her last couple of menses were painful and heavy again.   History of breast cancer > 5 years ago, she is now just on screening mammogram annually History of hep C- cured Most recent complete labs about a year ago- will plan to get for her today NCCSR: she was getting phentermine from a Dr. Richard Miu- however she just used this for one month and decided to stop  Filled ambien from me on 4/16. She takes 5 mg She very rarely uses the klonopin- last took months ago She uses ambien as needed for insomnia, maybe 4 nights of the week Never a smoker, rare alcohol   Pap: a year ago, normal Labs: a year ago- just cereal several hours ago  Mammo:  UTD  I also did see her for hip pain in the fall, treated with prednisone.  This did help her temporarily- for a week or two- but then it came back.  Tramadol also helped a little bit, "took the edge off," but she is not just taking tylenol for this  She uses ambien as needed for sleep, but her hip is waking her up sometimes Overall she has had this for over a year.   She wonders if this might be her piriformis It is starting to limit what she can do- they took a trip to [facility] and she had a really hard time with all the walking. She would like to see sports med for a consultation    [place]:  08/18/2017  1  03/06/2017  Zolpidem Tartrate 10 Mg Tablet  15 30 Je Cop   [id number]  [facility] ([id number])  5 0.25 LME Comm Ins  [place]  07/31/2017  1  07/30/2017  Phentermine 37.5 Mg Tablet  30 30 Vi Gan  [id number]  [facility] ([id number])  0  Comm Ins  [place]  07/18/2017  1  03/06/2017  Zolpidem Tartrate 10 Mg Tablet  15 30 Je Cop  [id number]  Wal ([id number])  4 0.25 LME Comm Ins  [place]  06/16/2017  1  03/06/2017  Zolpidem Tartrate 10 Mg Tablet  15 30 Je Cop  [id number]  Wal ([id number])  3 0.25 LME Comm Ins  [place]  05/16/2017  1  03/06/2017  Zolpidem Tartrate 10 Mg Tablet  15 30 Je Cop  [id number]  Wal ([id number])  2 0.25 LME     Her endometrial ablation was in 2016 Her menses are lighter now, can be irregular  She does self breast exam and declines clinical exam today   She does notice some easy bruising as well for the last year or so - she will seem to get a bruise with minimal trauma, but no other unusual bleeding noted    Patient Active Problem List   Diagnosis Date Noted  . History of hepatitis  C 02/01/2016  . Hyperlipidemia 09/21/2015  . 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 Osteopenia 09/21/2015  . Vitamin D deficiency 09/21/2015  . Hyperthyroidism 09/21/2015  . Insomnia 08/14/2015  . Anxiety state 08/14/2015    Past Medical History:  Diagnosis Date  . Cancer [facility])    Breast Cancer  . Hepatitis C   . Urinary tract infection     Past Surgical History:  Procedure Laterality Date  . BREAST BIOPSY  2011  . CHOLECYSTECTOMY  1991  . MASTECTOMY Left 2011  . NECK SURGERY  2007   Neck Fusion; Ruptured Disk  . NOVASURE ABLATION  11/2014  . TONSILLECTOMY  1988  . TUBAL LIGATION      Social History   Tobacco Use  . Smoking status: Never Smoker  . Smokeless tobacco: Never Used  Substance Use Topics  . Alcohol use: Yes    Alcohol/week: 1.2 oz    Types: 2 Glasses of wine per week    Comment: Occasional  . Drug use: No    Family History  Problem Relation Age of Onset  . Heart disease Mother   . Heart disease Father   . Heart attack Father   . Diabetes Neg Hx     No  Known Allergies  Medication list has been reviewed and updated.  Current Outpatient Medications on File Prior to Visit  Medication Sig Dispense Refill  . zolpidem (AMBIEN) 10 MG tablet Take 5 mg by mouth at bedtime as needed for insomnia 30 tablet 2  . clonazePAM (KLONOPIN) 0.5 MG tablet Take 1 tablet (0.5 mg total) by mouth daily. Use as needed for anxiety (Patient not taking: Reported on 09/10/2017) 30 tablet 0   No current facility-administered medications on file prior to visit.     Review of Systems:  As per HPI- otherwise negative.   Physical Examination: Vitals:   09/10/17 1301  BP: 128/86  Pulse: 74  Resp: 16  Temp: 97.9 F (36.6 C)  SpO2: 95%   Vitals:   09/10/17 1301  Weight: 160 lb (72.6 kg)  Height: 5' 4"  (1.626 m)   Body mass index is 27.46 kg/m. Ideal Body Weight: Weight in (lb) to have BMI = 25: 145.3  GEN: WDWN, NAD, Non-toxic, A & O x 3, looks well but does tan  HEENT: Atraumatic, Normocephalic. Neck supple. No masses, No LAD. Bilateral TM wnl, oropharynx normal.  PEERL,EOMI.   Ears and Nose: No external deformity. CV: RRR, No M/G/R. No JVD. No thrill. No extra heart sounds. PULM: CTA B, no wheezes, crackles, rhonchi. No retractions. No resp. distress. No accessory muscle use. ABD: S, NT, ND EXTR: No c/c/e NEURO Normal gait.  PSYCH: Normally interactive. Conversant. Not depressed or anxious appearing.  Calm demeanor.  She has a couple of small bruises- on her foot and left arm   Assessment and Plan: Physical exam  Screening for diabetes mellitus - Plan: Comprehensive metabolic panel, Hemoglobin A1c  Screening for hyperlipidemia - Plan: Lipid panel  Screening for deficiency anemia - Plan: CBC  Left leg paresthesias - Plan: Ambulatory referral to Sports Medicine  Easy bruising - Plan: CBC, Comprehensive metabolic panel, Pathologist smear review  Insomnia, unspecified type - Plan: zolpidem (AMBIEN) 10 MG tablet  CPE today Overall she is  doing well Labs pending as above Easy bruising- will run a CBC and smear for her Referral to sports med for her leg and back pain  Refilled her Lorrin Mais which she will  continue to use as little as possible, 5 mg   Signed Lamar Blinks, MD Received labs, but await pathology review   Results for orders placed or performed in visit on 09/10/17  Comprehensive metabolic panel  Result Value Ref Range   Sodium 140 135 - 145 mEq/L   Potassium 5.4 (H) 3.5 - 5.1 mEq/L   Chloride 105 96 - 112 mEq/L   CO2 32 19 - 32 mEq/L   Glucose, Bld 81 70 - 99 mg/dL   BUN 8 6 - 23 mg/dL   Creatinine, Ser 0.65 0.40 - 1.20 mg/dL   Total Bilirubin 0.5 0.2 - 1.2 mg/dL   Alkaline Phosphatase 33 (L) 39 - 117 U/L   AST 11 0 - 37 U/L   ALT 9 0 - 35 U/L   Total Protein 7.0 6.0 - 8.3 g/dL   Albumin 4.1 3.5 - 5.2 g/dL   Calcium 9.6 8.4 - 10.5 mg/dL   GFR 103.54 >60.00 mL/min  Hemoglobin A1c  Result Value Ref Range   Hgb A1c MFr Bld 5.1 4.6 - 6.5 %  Lipid panel  Result Value Ref Range   Cholesterol 183 0 - 200 mg/dL   Triglycerides 63.0 0.0 - 149.0 mg/dL   HDL 63.60 >39.00 mg/dL   VLDL 12.6 0.0 - 40.0 mg/dL   LDL Cholesterol 107 (H) 0 - 99 mg/dL   Total CHOL/HDL Ratio 3    NonHDL 119.18    Message to pt A1c- screening for diabetes- is normal Cholesterol is very good overall Blood counts are normal, and pathology smear is ok!   Your metabolic profile is normal except your potassium is a bit high- this is likely due to blood processing, but we need to recheck and see it go back to normal I will order a repeat BMP for you- please come in for a blood draw in the next week or so and we will recheck this.  Otherwise we can plan to visit in a year Take care!

## 2017-09-10 ENCOUNTER — Ambulatory Visit (INDEPENDENT_AMBULATORY_CARE_PROVIDER_SITE_OTHER): Payer: Managed Care, Other (non HMO) | Admitting: Family Medicine

## 2017-09-10 ENCOUNTER — Encounter: Payer: Self-pay | Admitting: Family Medicine

## 2017-09-10 VITALS — BP 128/86 | HR 74 | Temp 97.9°F | Resp 16 | Ht 64.0 in | Wt 160.0 lb

## 2017-09-10 DIAGNOSIS — E875 Hyperkalemia: Secondary | ICD-10-CM | POA: Diagnosis not present

## 2017-09-10 DIAGNOSIS — R202 Paresthesia of skin: Secondary | ICD-10-CM | POA: Diagnosis not present

## 2017-09-10 DIAGNOSIS — Z130 Encounter for screening for diseases of the blood and blood-forming organs and certain disorders involving the immune mechanism: Secondary | ICD-10-CM | POA: Diagnosis not present

## 2017-09-10 DIAGNOSIS — R233 Spontaneous ecchymoses: Secondary | ICD-10-CM

## 2017-09-10 DIAGNOSIS — Z13220 Encounter for screening for lipoid disorders: Secondary | ICD-10-CM | POA: Diagnosis not present

## 2017-09-10 DIAGNOSIS — G4700 Insomnia, unspecified: Secondary | ICD-10-CM

## 2017-09-10 DIAGNOSIS — R238 Other skin changes: Secondary | ICD-10-CM

## 2017-09-10 DIAGNOSIS — Z0000 Encounter for general adult medical examination without abnormal findings: Secondary | ICD-10-CM | POA: Diagnosis not present

## 2017-09-10 DIAGNOSIS — Z131 Encounter for screening for diabetes mellitus: Secondary | ICD-10-CM | POA: Diagnosis not present

## 2017-09-10 LAB — COMPREHENSIVE METABOLIC PANEL
ALT: 9 U/L (ref 0–35)
AST: 11 U/L (ref 0–37)
Albumin: 4.1 g/dL (ref 3.5–5.2)
Alkaline Phosphatase: 33 U/L — ABNORMAL LOW (ref 39–117)
BUN: 8 mg/dL (ref 6–23)
CALCIUM: 9.6 mg/dL (ref 8.4–10.5)
CHLORIDE: 105 meq/L (ref 96–112)
CO2: 32 meq/L (ref 19–32)
Creatinine, Ser: 0.65 mg/dL (ref 0.40–1.20)
GFR: 103.54 mL/min (ref >60.00)
Glucose, Bld: 81 mg/dL (ref 70–99)
POTASSIUM: 5.4 meq/L — AB (ref 3.5–5.1)
SODIUM: 140 meq/L (ref 135–145)
Total Bilirubin: 0.5 mg/dL (ref 0.2–1.2)
Total Protein: 7 g/dL (ref 6.0–8.3)

## 2017-09-10 LAB — LIPID PANEL
CHOLESTEROL: 183 mg/dL (ref 0–200)
HDL: 63.6 mg/dL (ref >39.00)
LDL Cholesterol: 107 mg/dL — ABNORMAL HIGH (ref 0–99)
NonHDL: 119.18
Total CHOL/HDL Ratio: 3
Triglycerides: 63 mg/dL (ref 0.0–149.0)
VLDL: 12.6 mg/dL (ref 0.0–40.0)

## 2017-09-10 LAB — HEMOGLOBIN A1C: Hgb A1c MFr Bld: 5.1 % (ref 4.6–6.5)

## 2017-09-10 MED ORDER — ZOLPIDEM TARTRATE 10 MG PO TABS: ORAL_TABLET | ORAL | 1 refills | Status: DC

## 2017-09-10 NOTE — Patient Instructions (Signed)
Great to see you as always- take care and I will be in touch with your labs asap We will also set you up to see Dr. Barbaraann Barthel about your hip and leg pain  Continue to use ambien as little as you can  Health Maintenance, Female Adopting a healthy lifestyle and getting preventive care can go a long way to promote health and wellness. Talk with your health care provider about what schedule of regular examinations is right for you. This is a good chance for you to check in with your provider about disease prevention and staying healthy. In between checkups, there are plenty of things you can do on your own. Experts have done a lot of research about which lifestyle changes and preventive measures are most likely to keep you healthy. Ask your health care provider for more information. Weight and diet Eat a healthy diet  Be sure to include plenty of vegetables, fruits, low-fat dairy products, and lean protein.  Do not eat a lot of foods high in solid fats, added sugars, or salt.  Get regular exercise. This is one of the most important things you can do for your health. ? Most adults should exercise for at least 150 minutes each week. The exercise should increase your heart rate and make you sweat (moderate-intensity exercise). ? Most adults should also do strengthening exercises at least twice a week. This is in addition to the moderate-intensity exercise.  Maintain a healthy weight  Body mass index (BMI) is a measurement that can be used to identify possible weight problems. It estimates body fat based on height and weight. Your health care provider can help determine your BMI and help you achieve or maintain a healthy weight.  For females 60 years of age and older: ? A BMI below 18.5 is considered underweight. ? A BMI of 18.5 to 24.9 is normal. ? A BMI of 25 to 29.9 is considered overweight. ? A BMI of 30 and above is considered obese.  Watch levels of cholesterol and blood lipids  You should  start having your blood tested for lipids and cholesterol at 48 years of age, then have this test every 5 years.  You may need to have your cholesterol levels checked more often if: ? Your lipid or cholesterol levels are high. ? You are older than 48 years of age. ? You are at high risk for heart disease.  Cancer screening Lung Cancer  Lung cancer screening is recommended for adults 48-20 years old who are at high risk for lung cancer because of a history of smoking.  A yearly low-dose CT scan of the lungs is recommended for people who: ? Currently smoke. ? Have quit within the past 15 years. ? Have at least a 30-pack-year history of smoking. A pack year is smoking an average of one pack of cigarettes a day for 1 year.  Yearly screening should continue until it has been 15 years since you quit.  Yearly screening should stop if you develop a health problem that would prevent you from having lung cancer treatment.  Breast Cancer  Practice breast self-awareness. This means understanding how your breasts normally appear and feel.  It also means doing regular breast self-exams. Let your health care provider know about any changes, no matter how small.  If you are in your 48s or 30s, you should have a clinical breast exam (CBE) by a health care provider every 48-3 years as part of a regular health exam.  If  you are 48 or older, have a CBE every year. Also consider having a breast X-ray (mammogram) every year.  If you have a family history of breast cancer, talk to your health care provider about genetic screening.  If you are at high risk for breast cancer, talk to your health care provider about having an MRI and a mammogram every year.  Breast cancer gene (BRCA) assessment is recommended for women who have family members with BRCA-related cancers. BRCA-related cancers include: ? Breast. ? Ovarian. ? Tubal. ? Peritoneal cancers.  Results of the assessment will determine the need  for genetic counseling and BRCA1 and BRCA2 testing.  Cervical Cancer Your health care provider may recommend that you be screened regularly for cancer of the pelvic organs (ovaries, uterus, and vagina). This screening involves a pelvic examination, including checking for microscopic changes to the surface of your cervix (Pap test). You may be encouraged to have this screening done every 3 years, beginning at age 48.  For women ages 48-65, health care providers may recommend pelvic exams and Pap testing every 3 years, or they may recommend the Pap and pelvic exam, combined with testing for human papilloma virus (HPV), every 5 years. Some types of HPV increase your risk of cervical cancer. Testing for HPV may also be done on women of any age with unclear Pap test results.  Other health care providers may not recommend any screening for nonpregnant women who are considered low risk for pelvic cancer and who do not have symptoms. Ask your health care provider if a screening pelvic exam is right for you.  If you have had past treatment for cervical cancer or a condition that could lead to cancer, you need Pap tests and screening for cancer for at least 48 years after your treatment. If Pap tests have been discontinued, your risk factors (such as having a new sexual partner) need to be reassessed to determine if screening should resume. Some women have medical problems that increase the chance of getting cervical cancer. In these cases, your health care provider may recommend more frequent screening and Pap tests.  Colorectal Cancer  This type of cancer can be detected and often prevented.  Routine colorectal cancer screening usually begins at 48 years of age and continues through 48 years of age.  Your health care provider may recommend screening at an earlier age if you have risk factors for colon cancer.  Your health care provider may also recommend using home test kits to check for hidden blood in  the stool.  A small camera at the end of a tube can be used to examine your colon directly (sigmoidoscopy or colonoscopy). This is done to check for the earliest forms of colorectal cancer.  Routine screening usually begins at age 50.  Direct examination of the colon should be repeated every 5-10 years through 48 years of age. However, you may need to be screened more often if early forms of precancerous polyps or small growths are found.  Skin Cancer  Check your skin from head to toe regularly.  Tell your health care provider about any new moles or changes in moles, especially if there is a change in a mole's shape or color.  Also tell your health care provider if you have a mole that is larger than the size of a pencil eraser.  Always use sunscreen. Apply sunscreen liberally and repeatedly throughout the day.  Protect yourself by wearing long sleeves, pants, a wide-brimmed hat, and sunglasses   whenever you are outside.  Heart disease, diabetes, and high blood pressure  High blood pressure causes heart disease and increases the risk of stroke. High blood pressure is more likely to develop in: ? People who have blood pressure in the high end of the normal range (130-139/85-89 mm Hg). ? People who are overweight or obese. ? People who are African American.  If you are 67-8 years of age, have your blood pressure checked every 3-5 years. If you are 69 years of age or older, have your blood pressure checked every year. You should have your blood pressure measured twice-once when you are at a hospital or clinic, and once when you are not at a hospital or clinic. Record the average of the two measurements. To check your blood pressure when you are not at a hospital or clinic, you can use: ? An automated blood pressure machine at a pharmacy. ? A home blood pressure monitor.  If you are between 39 years and 60 years old, ask your health care provider if you should take aspirin to prevent  strokes.  Have regular diabetes screenings. This involves taking a blood sample to check your fasting blood sugar level. ? If you are at a normal weight and have a low risk for diabetes, have this test once every three years after 48 years of age. ? If you are overweight and have a high risk for diabetes, consider being tested at a younger age or more often. Preventing infection Hepatitis B  If you have a higher risk for hepatitis B, you should be screened for this virus. You are considered at high risk for hepatitis B if: ? You were born in a country where hepatitis B is common. Ask your health care provider which countries are considered high risk. ? Your parents were born in a high-risk country, and you have not been immunized against hepatitis B (hepatitis B vaccine). ? You have HIV or AIDS. ? You use needles to inject street drugs. ? You live with someone who has hepatitis B. ? You have had sex with someone who has hepatitis B. ? You get hemodialysis treatment. ? You take certain medicines for conditions, including cancer, organ transplantation, and autoimmune conditions.  Hepatitis C  Blood testing is recommended for: ? Everyone born from [birth year] through 1966. ? Anyone with known risk factors for hepatitis C.  Sexually transmitted infections (STIs)  You should be screened for sexually transmitted infections (STIs) including gonorrhea and chlamydia if: ? You are sexually active and are younger than 48 years of age. ? You are older than 48 years of age and your health care provider tells you that you are at risk for this type of infection. ? Your sexual activity has changed since you were last screened and you are at an increased risk for chlamydia or gonorrhea. Ask your health care provider if you are at risk.  If you do not have HIV, but are at risk, it may be recommended that you take a prescription medicine daily to prevent HIV infection. This is called pre-exposure prophylaxis  (PrEP). You are considered at risk if: ? You are sexually active and do not regularly use condoms or know the HIV status of your partner(s). ? You take drugs by injection. ? You are sexually active with a partner who has HIV.  Talk with your health care provider about whether you are at high risk of being infected with HIV. If you choose to begin PrEP, you should  first be tested for HIV. You should then be tested every 3 months for as long as you are taking PrEP. Pregnancy  If you are premenopausal and you may become pregnant, ask your health care provider about preconception counseling.  If you may become pregnant, take 400 to 800 micrograms (mcg) of folic acid every day.  If you want to prevent pregnancy, talk to your health care provider about birth control (contraception). Osteoporosis and menopause  Osteoporosis is a disease in which the bones lose minerals and strength with aging. This can result in serious bone fractures. Your risk for osteoporosis can be identified using a bone density scan.  If you are 66 years of age or older, or if you are at risk for osteoporosis and fractures, ask your health care provider if you should be screened.  Ask your health care provider whether you should take a calcium or vitamin D supplement to lower your risk for osteoporosis.  Menopause may have certain physical symptoms and risks.  Hormone replacement therapy may reduce some of these symptoms and risks. Talk to your health care provider about whether hormone replacement therapy is right for you. Follow these instructions at home:  Schedule regular health, dental, and eye exams.  Stay current with your immunizations.  Do not use any tobacco products including cigarettes, chewing tobacco, or electronic cigarettes.  If you are pregnant, do not drink alcohol.  If you are breastfeeding, limit how much and how often you drink alcohol.  Limit alcohol intake to no more than 1 drink per day for  nonpregnant women. One drink equals 12 ounces of beer, 5 ounces of wine, or 1 ounces of hard liquor.  Do not use street drugs.  Do not share needles.  Ask your health care provider for help if you need support or information about quitting drugs.  Tell your health care provider if you often feel depressed.  Tell your health care provider if you have ever been abused or do not feel safe at home. This information is not intended to replace advice given to you by your health care provider. Make sure you discuss any questions you have with your health care provider. Document Released: 10/22/2010 Document Revised: 09/14/2015 Document Reviewed: 01/10/2015 Elsevier Interactive Patient Education  [employer].

## 2017-09-10 NOTE — Addendum Note (Signed)
Addended by: Harl Bowie on: 09/10/2017 01:40 PM   Modules accepted: Orders

## 2017-09-10 NOTE — Addendum Note (Signed)
Addended by: Harl Bowie on: 09/10/2017 01:59 PM   Modules accepted: Orders

## 2017-09-11 LAB — CBC (INCLUDES DIFF/PLT) WITH PATHOLOGIST REVIEW
BASOS ABS: 21 {cells}/uL (ref 0–200)
Basophils Relative: 0.3 %
EOS PCT: 0.6 %
Eosinophils Absolute: 41 cells/uL (ref 15–500)
HEMATOCRIT: 41.9 % (ref 35.0–45.0)
HEMOGLOBIN: 14.1 g/dL (ref 11.7–15.5)
LYMPHS ABS: 2146 {cells}/uL (ref 850–3900)
MCH: 28.9 pg (ref 27.0–33.0)
MCHC: 33.7 g/dL (ref 32.0–36.0)
MCV: 85.9 fL (ref 80.0–100.0)
MPV: 11.2 fL (ref 7.5–12.5)
Monocytes Relative: 5.7 %
NEUTROS PCT: 62.3 %
Neutro Abs: 4299 cells/uL (ref 1500–7800)
Platelets: 206 10*3/uL (ref 140–400)
RBC: 4.88 10*6/uL (ref 3.80–5.10)
RDW: 12.4 % (ref 11.0–15.0)
TOTAL LYMPHOCYTE: 31.1 %
WBC: 6.9 10*3/uL (ref 3.8–10.8)
WBCMIX: 393 {cells}/uL (ref 200–950)

## 2017-09-12 ENCOUNTER — Encounter: Payer: Self-pay | Admitting: Family Medicine

## 2017-09-12 NOTE — Addendum Note (Signed)
Addended by: Lamar Blinks C on: 09/12/2017 05:26 PM   Modules accepted: Orders

## 2017-09-18 ENCOUNTER — Encounter: Payer: Self-pay | Admitting: Family Medicine

## 2017-09-18 ENCOUNTER — Ambulatory Visit (INDEPENDENT_AMBULATORY_CARE_PROVIDER_SITE_OTHER): Payer: Managed Care, Other (non HMO) | Admitting: Family Medicine

## 2017-09-18 VITALS — BP 135/89 | HR 77 | Ht 64.0 in | Wt 160.0 lb

## 2017-09-18 DIAGNOSIS — M79605 Pain in left leg: Secondary | ICD-10-CM

## 2017-09-18 DIAGNOSIS — M545 Low back pain, unspecified: Secondary | ICD-10-CM

## 2017-09-18 DIAGNOSIS — M5416 Radiculopathy, lumbar region: Secondary | ICD-10-CM

## 2017-09-18 MED ORDER — PREDNISONE 10 MG PO TABS: ORAL_TABLET | ORAL | 0 refills | Status: DC

## 2017-09-18 NOTE — Patient Instructions (Signed)
You have lumbar radiculopathy (a pinched nerve in your low back). Take tylenol for baseline pain relief (1-2 extra strength tabs 3x/day) A prednisone dose pack is the best option for immediate relief and may be prescribed.  Don't take aleve or ibuprofen while taking the prednisone. Consider muscle relaxant, pain medication. Stay as active as possible. Physical therapy has been shown to be helpful as well - start this. Strengthening of low back muscles, abdominal musculature are key for long term pain relief. If not improving, will consider further imaging (MRI). Follow up with me in 1 month.

## 2017-09-21 ENCOUNTER — Encounter: Payer: Self-pay | Admitting: Family Medicine

## 2017-09-21 DIAGNOSIS — M545 Low back pain, unspecified: Secondary | ICD-10-CM | POA: Insufficient documentation

## 2017-09-21 DIAGNOSIS — M79605 Pain in left leg: Secondary | ICD-10-CM

## 2017-09-21 NOTE — Assessment & Plan Note (Signed)
consistent with lumbar radiculopathy.  Independently reviewed radiographs and no evidence bony abnormalities.  Tylenol, prednisone dose pack coupled with physical therapy.  F/u in 1 month.  Consider MRI lumbar spine if not improving.

## 2017-09-21 NOTE — Progress Notes (Signed)
PCP and consultation requested by: Copland, Gay Filler, MD  Subjective:   HPI: Patient is a 47 y.o. female here for Left leg/buttock pain.  Patient reports for about a year she's had left leg/buttock pain. Pain currently 5/10. States about 4 out of 7 days a week she has pain. Radiates into left thigh down to great toe with numbness. Pain is a shock type of pain. Leg feels weak. Tried prednisone which helped but recurred when stopped this. No skin changes.  Past Medical History:  Diagnosis Date  . Cancer [facility])    Breast Cancer  . Hepatitis C   . Urinary tract infection     Current Outpatient Medications on File Prior to Visit  Medication Sig Dispense Refill  . clonazePAM (KLONOPIN) 0.5 MG tablet Take 1 tablet (0.5 mg total) by mouth daily. Use as needed for anxiety (Patient not taking: Reported on 09/10/2017) 30 tablet 0  . zolpidem (AMBIEN) 10 MG tablet Take 5 mg by mouth at bedtime as needed for insomnia 45 tablet 1   No current facility-administered medications on file prior to visit.     Past Surgical History:  Procedure Laterality Date  . BREAST BIOPSY  2011  . CHOLECYSTECTOMY  1991  . MASTECTOMY Left 2011  . NECK SURGERY  2007   Neck Fusion; Ruptured Disk  . NOVASURE ABLATION  11/2014  . TONSILLECTOMY  1988  . TUBAL LIGATION      No Known Allergies  Social History   Socioeconomic History  . Marital status: Married    Spouse name: Not on file  . Number of children: Not on file  . Years of education: Not on file  . Highest education level: Not on file  Occupational History  . Not on file  Social Needs  . Financial resource strain: Not on file  . Food insecurity:    Worry: Not on file    Inability: Not on file  . Transportation needs:    Medical: Not on file    Non-medical: Not on file  Tobacco Use  . Smoking status: Never Smoker  . Smokeless tobacco: Never Used  Substance and Sexual Activity  . Alcohol use: Yes    Alcohol/week: 1.2 oz    Types: 2  Glasses of wine per week    Comment: Occasional  . Drug use: No  . Sexual activity: Yes  Lifestyle  . Physical activity:    Days per week: Not on file    Minutes per session: Not on file  . Stress: Not on file  Relationships  . Social connections:    Talks on phone: Not on file    Gets together: Not on file    Attends religious service: Not on file    Active member of club or organization: Not on file    Attends meetings of clubs or organizations: Not on file    Relationship status: Not on file  . Intimate partner violence:    Fear of current or ex partner: Not on file    Emotionally abused: Not on file    Physically abused: Not on file    Forced sexual activity: Not on file  Other Topics Concern  . Not on file  Social History Narrative  . Not on file    Family History  Problem Relation Age of Onset  . Heart disease Mother   . Heart disease Father   . Heart attack Father   . Diabetes Neg Hx     BP  135/89   Pulse 77   Ht 5' 4"  (1.626 m)   Wt 160 lb (72.6 kg)   LMP 09/08/2017 (Exact Date) Comment: "last only a day or two"  BMI 27.46 kg/m   Review of Systems: See HPI above.     Objective:  Physical Exam:  Gen: NAD, comfortable in exam room  Back: No gross deformity, scoliosis. TTP left lumbar paraspinal region, buttock.  No midline or bony TTP. FROM. Strength LEs 5/5 all muscle groups except 5-/5 knee extension on left.   2+ MSRs in patellar and achilles tendons, equal bilaterally. Negative SLRs. Sensation intact to light touch bilaterally.  Left hip: No deformity. FROM with 5/5 strength. No tenderness to palpation. NVI distally currently. Negative logroll. Negative fabers and piriformis stretches.   Assessment & Plan:  1. Low back pain with radiation into left leg - consistent with lumbar radiculopathy.  Independently reviewed radiographs and no evidence bony abnormalities.  Tylenol, prednisone dose pack coupled with physical therapy.  F/u in 1  month.  Consider MRI lumbar spine if not improving.

## 2017-09-22 ENCOUNTER — Other Ambulatory Visit (INDEPENDENT_AMBULATORY_CARE_PROVIDER_SITE_OTHER): Payer: Managed Care, Other (non HMO)

## 2017-09-22 DIAGNOSIS — E875 Hyperkalemia: Secondary | ICD-10-CM | POA: Diagnosis not present

## 2017-09-22 LAB — BASIC METABOLIC PANEL
BUN: 12 mg/dL (ref 6–23)
CHLORIDE: 105 meq/L (ref 96–112)
CO2: 30 meq/L (ref 19–32)
CREATININE: 0.68 mg/dL (ref 0.40–1.20)
Calcium: 9 mg/dL (ref 8.4–10.5)
GFR: 98.27 mL/min (ref >60.00)
Glucose, Bld: 99 mg/dL (ref 70–99)
POTASSIUM: 3.4 meq/L — AB (ref 3.5–5.1)
Sodium: 141 mEq/L (ref 135–145)

## 2017-10-07 ENCOUNTER — Other Ambulatory Visit: Payer: Self-pay

## 2017-10-07 ENCOUNTER — Ambulatory Visit: Payer: Managed Care, Other (non HMO) | Attending: Family Medicine | Admitting: Physical Therapy

## 2017-10-07 DIAGNOSIS — R262 Difficulty in walking, not elsewhere classified: Secondary | ICD-10-CM | POA: Insufficient documentation

## 2017-10-07 DIAGNOSIS — M6281 Muscle weakness (generalized): Secondary | ICD-10-CM

## 2017-10-07 DIAGNOSIS — R29898 Other symptoms and signs involving the musculoskeletal system: Secondary | ICD-10-CM

## 2017-10-07 DIAGNOSIS — M5416 Radiculopathy, lumbar region: Secondary | ICD-10-CM | POA: Diagnosis not present

## 2017-10-08 NOTE — Therapy (Signed)
[facility] [address]  [place], [place], [address] Phone: [phone]   Fax:  [phone]  Physical Therapy Evaluation  Patient Details  Name: Shirley Holder MRN: [id number] Date of Birth: 11/27/1970 Referring Provider: Karlton Lemon, MD   Encounter Date: 10/07/2017  PT End of Session - 10/07/17 1637    Visit Number  1    Number of Visits  12    Date for PT Re-Evaluation  11/18/17    Authorization Type  Cign[employer]    Authorization - Number of Visits  51    PT Start Time  1621    PT Stop Time  1705    PT Time Calculation (min)  44 min    Activity Tolerance  Patient tolerated treatment well    Behavior During Therapy  [facility] for tasks assessed/performed       Past Medical History:  Diagnosis Date  . Cancer [facility])    Breast Cancer  . Hepatitis C   . Urinary tract infection     Past Surgical History:  Procedure Laterality Date  . BREAST BIOPSY  2011  . CHOLECYSTECTOMY  1991  . MASTECTOMY Left 2011  . NECK SURGERY  2007   Neck Fusion; Ruptured Disk  . NOVASURE ABLATION  11/2014  . TONSILLECTOMY  1988  . TUBAL LIGATION      There were no vitals filed for this visit.   Subjective Assessment - 10/07/17 1625    Subjective  Pt reports that over the last year and a half she has had pain starting in the L glute area that will move into the front of the L thigh and sometimes into the front of the calf. It does not happen often but she will sometimes experience some numbness in the big toe of her L foot. Pt states that the pain is relieved by the Ambien. Pt states that her pain is usually worse at night and but that she experienced pain earlier in the day today. Pt states that she avoids tight pants because they cause discomfort in her leg and in the past she has been told that she has a pinched nerve in the lower back. 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     Limitations  Sitting;Walking;Standing;House hold activities    How long can you sit comfortably?  No limitations; only becomes uncomfortable when the legs are crossed    How long can you stand comfortably?  15 minutes    How long can you walk comfortably?  15 minutes    Patient Stated Goals  to be pain free     Currently in Pain?  No/denies    Pain Score  0-No pain up to 8/10 at worst     Pain Location  Buttocks    Pain Orientation  Left;Anterior;Distal;Proximal    Pain Descriptors / Indicators  Tightness;Aching;Numbness    Pain Type  Chronic pain    Pain Radiating Towards  intermittent pain, numbness and tingling down anterior L LE to great toe    Pain Onset  Today ~1.5 yrs    Pain Frequency  Intermittent    Aggravating Factors   crossing her legs, prolonged standing or walking    Pain Relieving Factors  steroids, Ambien    Effect of Pain on Daily Activities  limits tolerance for work tasks cleaning house  [facility] PT Assessment - 10/07/17 1631      Assessment   Medical Diagnosis  Lumbar radiculopathy    Referring Provider  Karlton Lemon, MD    Onset Date/Surgical Date  -- ~1.5 yrs    Next MD Visit  10/22/2017    Prior Therapy  No      Balance Screen   Has the patient fallen in the past 6 months  No    Has the patient had a decrease in activity level because of a fear of falling?   No    Is the patient reluctant to leave their home because of a fear of falling?   No      Home Environment   Living Environment  Private residence    Type of [place] Access  Stairs to enter    Entrance Stairs-Number of Steps  [place]  One level      Prior Function   Level of Independence  Independent    Vocation  Full time employment    Vocation Requirements  [employer], sewing, yard work      Observation/Other Assessments   Focus on Therapeutic Outcomes (FOTO)   Lumbar spine - 54% (46% limitation); predicted 65% (35% limitation)       AROM   Lumbar Flexion  hands to ankles - tightness in back of legs    Lumbar Extension  40% limited    Lumbar - Right Side Bend  hand to fibular head    Lumbar - Left Side Bend  hand to fibular head - L LBP    Lumbar - Right Rotation  25% limitation    Lumbar - Left Rotation  25% limitation      Strength   Right/Left Hip  Right;Left    Right Hip Flexion  4+/5    Right Hip Extension  4/5    Right Hip External Rotation   4+/5    Right Hip Internal Rotation  4+/5    Right Hip ABduction  4+/5    Right Hip ADduction  4/5    Left Hip Flexion  4+/5    Left Hip Extension  4/5    Left Hip External Rotation  4-/5    Left Hip Internal Rotation  4-/5 pain with resistance    Left Hip ABduction  4/5    Left Hip ADduction  4/5    Right/Left Knee  Right;Left    Right Knee Flexion  4/5    Right Knee Extension  4+/5    Left Knee Flexion  4/5    Left Knee Extension  4+/5      Flexibility   Hamstrings  B mild tightness    Quadriceps  Bilateral mild to moderate tightness L>R    ITB  Mild tightness in L ITB    Piriformis  Mild tigness on R; Moderate tightness on L      Palpation   SI assessment   Apparent L posterior innominate rotation of pelvis on sacrum with TTP over  L PSIS    Palpation comment  TTP along in L TFL. Increased tightness in the R paraspinal musculature      Ober's Test   Findings  Negative    Side  Right;Left    Comments  Pain when testing L side; mild to moderate tightness in ITB Pain with L side; mild to moderate tightness in ITB  Objective measurements completed on examination: See above findings.      [place] Adult PT Treatment/Exercise - 10/07/17 1656      Lumbar Exercises: Supine   Pelvic Tilt  3 reps    Pelvic Tilt Limitations  PPT    Bridge  5 seconds    Bridge Limitations  with red TB wrapped around the knee      Knee/Hip Exercises: Stretches   Passive Hamstring Stretch  30 seconds    Passive Hamstring Stretch Limitations   supine using stretch strap around the ball of the toe to incorporate calf stretch    Hip Flexor Stretch  30 seconds    Hip Flexor Stretch Limitations  mod thomas using stretch strap around the ankle to bring the knee into flexion    ITB Stretch  30 seconds;Left    ITB Stretch Limitations  standing sidebend with hip extension for TFL      Knee/Hip Exercises: Supine   Bridges  Both    Bridges Limitations  Red TB wrapped around the knees; hold for 5 seconds abducting knees into therapist hands; hold for 5 seconds      Manual Therapy   Manual Therapy  Muscle Energy Technique    Muscle Energy Technique  MET for correction of apparent L posterior innominate rotation followed by pelvic shotgun - alignment appeared normalized             PT Education - 10/07/17 1705    Education Details  PT eval findings, anticipated POC & initial HEP    Person(s) Educated  Patient    Methods  Explanation;Demonstration;Handout    Comprehension  Verbalized understanding;Returned demonstration;Need further instruction       PT Short Term Goals - 10/07/17 1705      PT SHORT TERM GOAL #1   Title  Independent with initial HEP    Status  New    Target Date  10/24/17      PT SHORT TERM GOAL #2   Title  Pt will verbalize/demonstrate understanding of proper posture and body mechanics with daily activities to minimize low back strain and avoid stress on SIJ    Status  New    Target Date  10/24/17        PT Long Term Goals - 10/07/17 1705      PT LONG TERM GOAL #1   Title  Independent with ongoing/advanced HEP    Status  New    Target Date  11/18/17      PT LONG TERM GOAL #2   Title  Pt will maintain neutral SIJ alignment w/o need for manual correction    Status  New    Target Date  11/18/17      PT LONG TERM GOAL #3   Title  Lumbar ROM WFL w/o increased pain    Status  New    Target Date  11/18/17      PT LONG TERM GOAL #4   Title  B proximal LE strength >/= 4+/5 for improved stability     Status  New    Target Date  11/18/17      PT LONG TERM GOAL #5   Title  Patient to report ability to perform work and daily activities without pain provocation    Status  New    Target Date  11/18/17             Plan - 10/07/17 1705    Clinical Impression Statement  Merle is a 47  y/o female who presents to OP PT for L sided lumbar radiculopathy of ~1.5 yrs duration. Pt reports some relief from recent steroid pack dosing prescribed by MD but is fearful of pain returning. Pt denies pain at time of eval, but reports pain up to 8/10 at worst in L low back/buttock and radiating into L thigh with numbness down to great toe which limits standing and walking tolerance. Lumbar ROM mildly limited with pt noting tightness in most directions and pain with L side bending. Proximal LE flexibility demonstrating mild to moderate restriction, with mild proximal LE weakness also evident. SIJ assessment revealed apparent L posterior innominate rotation of pelvis on sacrum which improved after MET. Pt will benefit from skilled PT to address deficits listed and allow return to normal activity level w/o pain interference.    Clinical Presentation  Stable    Clinical Decision Making  Low    Rehab Potential  Good    PT Frequency  2x / week    PT Duration  6 weeks    PT Treatment/Interventions  Patient/family education;ADLs/Self Care Home Management;Therapeutic exercise;Therapeutic activities;Neuromuscular re-education;Manual techniques;Dry needling;Taping;Electrical Stimulation;Cryotherapy;Moist Heat;Ultrasound;Traction;Iontophoresis 31m/ml Dexamethasone    Consulted and Agree with Plan of Care  Patient       Patient will benefit from skilled therapeutic intervention in order to improve the following deficits and impairments:  Pain, Impaired flexibility, Decreased range of motion, Decreased strength, Postural dysfunction, Improper body mechanics, Increased muscle spasms, Decreased activity tolerance,  Difficulty walking  Visit Diagnosis: Radiculopathy, lumbar region  Muscle weakness (generalized)  Difficulty in walking, not elsewhere classified  Other symptoms and signs involving the musculoskeletal system     Problem List Patient Active Problem List   Diagnosis Date Noted  . Low back pain radiating to left leg 09/21/2017  . History of hepatitis C 02/01/2016  . Hyperlipidemia 09/21/2015  . 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 Osteopenia 09/21/2015  . Vitamin D deficiency 09/21/2015  . Hyperthyroidism 09/21/2015  . Insomnia 08/14/2015  . Anxiety state 08/14/2015    JPercival Spanish PT, MPT 10/08/2017, 9:08 AM  [facility] [place] [address]Phone: [phone]  Fax:  [phone] Name: CAshtin MelicharMRN: [id number] of Birth: [birth year]

## 2017-10-13 ENCOUNTER — Encounter: Payer: Self-pay | Admitting: Family Medicine

## 2017-10-14 ENCOUNTER — Ambulatory Visit: Payer: Managed Care, Other (non HMO)

## 2017-10-14 DIAGNOSIS — M5416 Radiculopathy, lumbar region: Secondary | ICD-10-CM | POA: Diagnosis not present

## 2017-10-14 DIAGNOSIS — R29898 Other symptoms and signs involving the musculoskeletal system: Secondary | ICD-10-CM

## 2017-10-14 DIAGNOSIS — R262 Difficulty in walking, not elsewhere classified: Secondary | ICD-10-CM

## 2017-10-14 DIAGNOSIS — M6281 Muscle weakness (generalized): Secondary | ICD-10-CM

## 2017-10-14 NOTE — Patient Instructions (Signed)

## 2017-10-14 NOTE — Therapy (Signed)
[facility] [address]  [place], [place], [address] Phone: [phone]   Fax:  [phone]  Physical Therapy Treatment  Patient Details  Name: Shirley Holder MRN: [id number] Date of Birth: [birth year] Referring Provider: Karlton Lemon, MD   Encounter Date: 10/14/2017  PT End of Session - 10/14/17 1540    Visit Number  2    Number of Visits  12    Date for PT Re-Evaluation  11/18/17    Authorization Type  Cigna    Authorization - Number of Visits  52    PT Start Time  1533    PT Stop Time  1615    PT Time Calculation (min)  42 min    Activity Tolerance  Patient tolerated treatment well    Behavior During Therapy  [facility] for tasks assessed/performed       Past Medical History:  Diagnosis Date  . Cancer [facility])    Breast Cancer  . Hepatitis C   . Urinary tract infection     Past Surgical History:  Procedure Laterality Date  . BREAST BIOPSY  2011  . CHOLECYSTECTOMY  1991  . MASTECTOMY Left 2011  . NECK SURGERY  2007   Neck Fusion; Ruptured Disk  . NOVASURE ABLATION  11/2014  . TONSILLECTOMY  1988  . TUBAL LIGATION      There were no vitals filed for this visit.  Subjective Assessment - 10/14/17 1537    Subjective  Notes no issues with HEP.      How long can you sit comfortably?  No limitations; only becomes uncomfortable when the legs are crossed    Patient Stated Goals  to be pain free     Currently in Pain?  Yes    Pain Score  3     Pain Location  Buttocks    Pain Orientation  Left    Pain Descriptors / Indicators  Cramping    Pain Radiating Towards  numbness and tingling down anterior L LE to ankle     Pain Onset  More than a month ago    Pain Frequency  Intermittent    Aggravating Factors   Crossing her legs, prolonged walking     Multiple Pain Sites  No                       [facility] Adult PT Treatment/Exercise - 10/14/17 1600      Self-Care   Self-Care  Posture;Other Self-Care  Comments    Posture  Instructed pt. on proper standing, sitting posture as to reduce lumbar strain with pt. verbalizing understanding     Other Self-Care Comments   Discussed proper body mechanics with various household activities like kitchen work, mopping, vacuuming, sweeping, bending, lifting, laundry as to reduce lumbar strain; pt. verbalizing understanding       Lumbar Exercises: Aerobic   Nustep  Lvl 4, 6 min       Lumbar Exercises: Standing   Functional Squats  15 reps;3 seconds    Functional Squats Limitations  TRX      Lumbar Exercises: Supine   Pelvic Tilt  10 reps;5 seconds    Pelvic Tilt Limitations  well tolerated however required tactile cueing for motion     Bridge  10 reps;5 seconds    Bridge Limitations  with isometric hip abduction/ER into red TB at knees       Knee/Hip Exercises: Hydrologist  20 seconds;Left    Hip Flexor Stretch  30 seconds    ITB Stretch  30 seconds;Left    ITB Stretch Limitations  supine with strap       Knee/Hip Exercises: Supine   Bridges  15 reps;Both             PT Education - 10/14/17 1550    Education Details  Posture and body mechanics handout     Person(s) Educated  Patient    Methods  Explanation;Demonstration;Verbal cues;Handout    Comprehension  Verbalized understanding;Returned demonstration;Verbal cues required;Need further instruction       PT Short Term Goals - 10/14/17 1540      PT SHORT TERM GOAL #1   Title  Independent with initial HEP    Status  Achieved      PT SHORT TERM GOAL #2   Title  Pt will verbalize/demonstrate understanding of proper posture and body mechanics with daily activities to minimize low back strain and avoid stress on SIJ    Status  Achieved        PT Long Term Goals - 10/14/17 1540      PT LONG TERM GOAL #1   Title  Independent with ongoing/advanced HEP    Status  On-going      PT LONG TERM GOAL #2   Title  Pt will maintain neutral SIJ alignment w/o  need for manual correction    Status  On-going      PT LONG TERM GOAL #3   Title  Lumbar ROM WFL w/o increased pain    Status  On-going      PT LONG TERM GOAL #4   Title  B proximal LE strength >/= 4+/5 for improved stability    Status  On-going      PT LONG TERM GOAL #5   Title  Patient to report ability to perform work and daily activities without pain provocation    Status  On-going            Plan - 10/14/17 1546    Clinical Impression Statement  Shirley Holder reporting good tolerance for HEP since last visit.  Tolerated mild progression of lumbopelvic strengthening today with addition of TRX squat well.  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  Pt. verbalized understanding however may require further skilled instruction with body mechanics training for full carryover.  Will continue to progress toward goals.      PT Treatment/Interventions  Patient/family education;ADLs/Self Care Home Management;Therapeutic exercise;Therapeutic activities;Neuromuscular re-education;Manual techniques;Dry needling;Taping;Electrical Stimulation;Cryotherapy;Moist Heat;Ultrasound;Traction;Iontophoresis 31m/ml Dexamethasone    Consulted and Agree with Plan of Care  Patient       Patient will benefit from skilled therapeutic intervention in order to improve the following deficits and impairments:  Pain, Impaired flexibility, Decreased range of motion, Decreased strength, Postural dysfunction, Improper body mechanics, Increased muscle spasms, Decreased activity tolerance, Difficulty walking  Visit Diagnosis: Radiculopathy, lumbar region  Muscle weakness (generalized)  Difficulty in walking, not elsewhere classified  Other symptoms and signs involving the musculoskeletal system     Problem List Patient Active Problem List   Diagnosis Date Noted  . Low back pain radiating to left  leg 09/21/2017  . History of hepatitis C 02/01/2016  . Hyperlipidemia 09/21/2015  . 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 Osteopenia 09/21/2015  . Vitamin D deficiency 09/21/2015  . Hyperthyroidism 09/21/2015  .  Insomnia 08/14/2015  . Anxiety state 08/14/2015   Bess Harvest, PTA 10/14/17 6:09 PM  [facility] [address]  [place], [place], [address] Phone: [phone]   Fax:  [phone]  Name: Shirley Holder MRN: [id number] Date of Birth: 27-Oct-1970

## 2017-10-15 ENCOUNTER — Ambulatory Visit: Payer: Managed Care, Other (non HMO)

## 2017-10-15 DIAGNOSIS — M5416 Radiculopathy, lumbar region: Secondary | ICD-10-CM | POA: Diagnosis not present

## 2017-10-15 DIAGNOSIS — R29898 Other symptoms and signs involving the musculoskeletal system: Secondary | ICD-10-CM

## 2017-10-15 DIAGNOSIS — M6281 Muscle weakness (generalized): Secondary | ICD-10-CM

## 2017-10-15 DIAGNOSIS — R262 Difficulty in walking, not elsewhere classified: Secondary | ICD-10-CM

## 2017-10-15 NOTE — Therapy (Signed)
[facility] [address]  [place], [place], [address] Phone: [phone]   Fax:  [phone]  Physical Therapy Treatment  Patient Details  Name: Shirley Holder MRN: [id number] Date of Birth: 1971-03-15 Referring Provider: Karlton Lemon, MD   Encounter Date: 10/15/2017  PT End of Session - 10/15/17 1537    Visit Number  3    Number of Visits  12    Date for PT Re-Evaluation  11/18/17    Authorization Type  Cigna    Authorization - Number of Visits  66    PT Start Time  1533    PT Stop Time  1625 ended sessoin with 10 min moist heat    PT Time Calculation (min)  52 min    Activity Tolerance  Patient tolerated treatment well    Behavior During Therapy  [facility] for tasks assessed/performed       Past Medical History:  Diagnosis Date  . Cancer [facility])    Breast Cancer  . Hepatitis C   . Urinary tract infection     Past Surgical History:  Procedure Laterality Date  . BREAST BIOPSY  2011  . CHOLECYSTECTOMY  1991  . MASTECTOMY Left 2011  . NECK SURGERY  2007   Neck Fusion; Ruptured Disk  . NOVASURE ABLATION  11/2014  . TONSILLECTOMY  1988  . TUBAL LIGATION      There were no vitals filed for this visit.  Subjective Assessment - 10/15/17 1535    Subjective  Pt. noting some back soreness after last visit which subsided this morning.  Notes some pain in L buttocks today.      Currently in Pain?  Yes    Pain Score  3     Pain Location  Back    Pain Orientation  Left    Pain Descriptors / Indicators  Dull;Aching    Pain Type  Chronic pain    Pain Radiating Towards  Dull ache extending into L buttocks and down posterior L LE into ankle    Pain Onset  More than a month ago    Pain Frequency  Intermittent    Multiple Pain Sites  No                       [facility] Adult PT Treatment/Exercise - 10/15/17 1548      Lumbar Exercises: Stretches   Lower Trunk Rotation  5 reps 5" hold each way     Other  Lumbar Stretch Exercise  Standing "hip hinge" 3" x 10 reps       Lumbar Exercises: Aerobic   Recumbent Bike  Lvl 1, 6 min       Lumbar Exercises: Standing   Wall Slides  10 reps;5 seconds    Wall Slides Limitations  leaning on orange p-ball       Lumbar Exercises: Supine   Bridge  5 seconds x 12 reps     Bridge Limitations  hip abd/ER isometrics into green looped TB at knees       Lumbar Exercises: Sidelying   Other Sidelying Lumbar Exercises  B sidelying "open book" stretch 5" x 5 reps       Modalities   Modalities  Moist Heat      Moist Heat Therapy   Number Minutes Moist Heat  10 Minutes    Moist Heat Location  Lumbar Spine + R buttock       Manual Therapy  Manual Therapy  Soft tissue mobilization;Myofascial release    Manual therapy comments  prone and sidelying with LE elevated on bolster     Soft tissue mobilization  STM to R-sided lumbar paraspinals; L glute in area of tenderness     Myofascial Release  TPR release along length of L piri; ttp                PT Short Term Goals - 10/14/17 1540      PT SHORT TERM GOAL #1   Title  Independent with initial HEP    Status  Achieved      PT SHORT TERM GOAL #2   Title  Pt will verbalize/demonstrate understanding of proper posture and body mechanics with daily activities to minimize low back strain and avoid stress on SIJ    Status  Achieved        PT Long Term Goals - 10/14/17 1540      PT LONG TERM GOAL #1   Title  Independent with ongoing/advanced HEP    Status  On-going      PT LONG TERM GOAL #2   Title  Pt will maintain neutral SIJ alignment w/o need for manual correction    Status  On-going      PT LONG TERM GOAL #3   Title  Lumbar ROM WFL w/o increased pain    Status  On-going      PT LONG TERM GOAL #4   Title  B proximal LE strength >/= 4+/5 for improved stability    Status  On-going      PT LONG TERM GOAL #5   Title  Patient to report ability to perform work and daily activities without  pain provocation    Status  On-going            Plan - 10/15/17 1537    Clinical Impression Statement  Pt. noting some increased pain last night which subsided this morning.  Unsure if activities in therapy or unusually physically demanding work-day yesterday caused increased pain.  Session focusing on gentle lumbopelvic ROM and strengthening activities to pt. tolerance with good response.  Manual therapy focused on reducing pain and tone in lumbar paraspinals and glutes, which was pt. primary complaint today.  Pt. noting reduction in pain levels following moist heat to end session today and left session pain free.  Will continue to progress toward goals.      PT Treatment/Interventions  Patient/family education;ADLs/Self Care Home Management;Therapeutic exercise;Therapeutic activities;Neuromuscular re-education;Manual techniques;Dry needling;Taping;Electrical Stimulation;Cryotherapy;Moist Heat;Ultrasound;Traction;Iontophoresis 74m/ml Dexamethasone    Consulted and Agree with Plan of Care  Patient       Patient will benefit from skilled therapeutic intervention in order to improve the following deficits and impairments:  Pain, Impaired flexibility, Decreased range of motion, Decreased strength, Postural dysfunction, Improper body mechanics, Increased muscle spasms, Decreased activity tolerance, Difficulty walking  Visit Diagnosis: Radiculopathy, lumbar region  Muscle weakness (generalized)  Difficulty in walking, not elsewhere classified  Other symptoms and signs involving the musculoskeletal system     Problem List Patient Active Problem List   Diagnosis Date Noted  . Low back pain radiating to left leg 09/21/2017  . History of hepatitis C 02/01/2016  . Hyperlipidemia 09/21/2015  . 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 Osteopenia 09/21/2015  . Vitamin D deficiency 09/21/2015  . Hyperthyroidism 09/21/2015  . Insomnia 08/14/2015  . Anxiety state  08/14/2015    MBess Harvest PTA 10/15/17 6:24 PM  [facility] [address]  [place], [place], [address] Phone: [phone]   Fax:  [phone]  Name: Koraline Phillipson MRN: [id number] Date of Birth: 12-20-1970

## 2017-10-21 ENCOUNTER — Ambulatory Visit: Payer: Managed Care, Other (non HMO) | Attending: Family Medicine

## 2017-10-21 DIAGNOSIS — M5416 Radiculopathy, lumbar region: Secondary | ICD-10-CM | POA: Diagnosis present

## 2017-10-21 DIAGNOSIS — M6281 Muscle weakness (generalized): Secondary | ICD-10-CM | POA: Diagnosis present

## 2017-10-21 DIAGNOSIS — R262 Difficulty in walking, not elsewhere classified: Secondary | ICD-10-CM | POA: Diagnosis present

## 2017-10-21 DIAGNOSIS — R29898 Other symptoms and signs involving the musculoskeletal system: Secondary | ICD-10-CM

## 2017-10-21 NOTE — Therapy (Addendum)
[facility] [address]  [place], [place], [address] Phone: [phone]   Fax:  [phone]  Physical Therapy Treatment  Patient Details  Name: Shirley Holder MRN: [id number] Date of Birth: [birth year] Referring Provider: Karlton Lemon, MD   Encounter Date: 10/21/2017  PT End of Session - 10/21/17 1627    Visit Number  4    Number of Visits  12    Date for PT Re-Evaluation  11/18/17    Authorization Type  [employer]    Authorization - Number of Visits  60    PT Start Time  1622 pt. arrived late     PT Stop Time  1715    PT Time Calculation (min)  53 min    Activity Tolerance  Patient tolerated treatment well    Behavior During Therapy  WFL for tasks assessed/performed       Past Medical History:  Diagnosis Date  . Cancer [facility])    Breast Cancer  . Hepatitis C   . Urinary tract infection     Past Surgical History:  Procedure Laterality Date  . BREAST BIOPSY  2011  . CHOLECYSTECTOMY  1991  . MASTECTOMY Left 2011  . NECK SURGERY  2007   Neck Fusion; Ruptured Disk  . NOVASURE ABLATION  11/2014  . TONSILLECTOMY  1988  . TUBAL LIGATION      There were no vitals filed for this visit.  Subjective Assessment - 10/21/17 1625    Subjective  Pt. reporting she had to help mother up from floor after falling today and has some L hip sorenes following this.  Pt. noting LE radiating pain over weekend.    How long can you sit comfortably?  No limitations; only becomes uncomfortable when the legs are crossed    Patient Stated Goals  to be pain free     Currently in Pain?  Yes    Pain Score  0-No pain up to 8/10 while walking     Pain Location  Hip    Pain Orientation  Left;Anterior;Lateral    Pain Descriptors / Indicators  Aching    Pain Type  Chronic pain    Pain Onset  In the past 7 days    Pain Frequency  Intermittent    Aggravating Factors   unsure     Multiple Pain Sites  No         [facility] PT Assessment -  10/21/17 1637      AROM   Lumbar Flexion  hands to ankles - tightness in back of legs    Lumbar Extension  40% limited - pain     Lumbar - Right Side Bend  Joint line - "pulling"     Lumbar - Left Side Bend  joint line - "pulling"    Lumbar - Right Rotation  WFL - no pain     Lumbar - Left Rotation  WFL - no pain       Strength   Right/Left Hip  Right;Left    Right Hip Flexion  4+/5    Right Hip Extension  4/5    Right Hip External Rotation   4+/5    Right Hip Internal Rotation  4+/5    Right Hip ABduction  4+/5    Right Hip ADduction  4+/5    Left Hip Flexion  4+/5    Left Hip Extension  4/5    Left Hip External Rotation  4+/5  Left Hip Internal Rotation  4/5    Left Hip ABduction  4+/5    Left Hip ADduction  4+/5    Right/Left Knee  Right;Left    Right Knee Flexion  4/5    Right Knee Extension  5/5    Left Knee Flexion  4/5    Left Knee Extension  4+/5                   [facility] Adult PT Treatment/Exercise - 10/21/17 1628      Lumbar Exercises: Stretches   Hip Flexor Stretch  2 reps;Left;30 seconds    Hip Flexor Stretch Limitations  Mod thomas pos, and half-kneeling knee on chair       Lumbar Exercises: Aerobic   Nustep  Lvl 4, 6 min       Lumbar Exercises: Standing   Other Standing Lumbar Exercises  Standing B pallof press with double red TB in door x 10 reps each way       Lumbar Exercises: Seated   Other Seated Lumbar Exercises  B HS curl with green TB x 10 reps      Knee/Hip Exercises: Stretches   ITB Stretch  30 seconds;2 reps    ITB Stretch Limitations  sidelying with LE off table and therapist support, supine with strap       Modalities   Modalities  Electrical Stimulation      Moist Heat Therapy   Number Minutes Moist Heat  15 Minutes    Moist Heat Location  Lumbar Spine      Electrical Stimulation   Electrical Stimulation Location  lumbar spine     Electrical Stimulation Action  IFC    Electrical Stimulation Parameters  to tolerance,  15'    Electrical Stimulation Goals  Pain               PT Short Term Goals - 10/14/17 1540      PT SHORT TERM GOAL #1   Title  Independent with initial HEP    Status  Achieved      PT SHORT TERM GOAL #2   Title  Pt will verbalize/demonstrate understanding of proper posture and body mechanics with daily activities to minimize low back strain and avoid stress on SIJ    Status  Achieved        PT Long Term Goals - 10/21/17 1648      PT LONG TERM GOAL #1   Title  Independent with ongoing/advanced HEP    Status  Partially Met      PT LONG TERM GOAL #2   Title  Pt will maintain neutral SIJ alignment w/o need for manual correction    Status  On-going      PT LONG TERM GOAL #3   Title  Lumbar ROM WFL w/o increased pain    Status  Partially Met Still limited in extension with pain provocation       PT LONG TERM GOAL #4   Title  B proximal LE strength >/= 4+/5 for improved stability    Status  Partially Met not met for B knee flexion, B hip extension, R hip IR 4/5      PT LONG TERM GOAL #5   Title  Patient to report ability to perform work and daily activities without pain provocation    Status  On-going Still having increased LBP with vacuuming             Plan - 10/21/17 1631  Clinical Impression Statement  Shirley Holder reporting she has been altering her body mechanics with house cleaning tasks as to reduce lumbar strain with improved tolerance however still having pain with vacuuming.  Able to demo improvement in B LE strength partially meeting goal.  Now only limited in lumbar AROM extension partially meeting this goal.  Did complain of pain at location of SIJ to end session however good relief from this noted following E-stim/moist heat.  Will monitor response at upcoming visit.      PT Treatment/Interventions  Patient/family education;ADLs/Self Care Home Management;Therapeutic exercise;Therapeutic activities;Neuromuscular re-education;Manual techniques;Dry  needling;Taping;Electrical Stimulation;Cryotherapy;Moist Heat;Ultrasound;Traction;Iontophoresis 33m/ml Dexamethasone    Consulted and Agree with Plan of Care  Patient       Patient will benefit from skilled therapeutic intervention in order to improve the following deficits and impairments:  Pain, Impaired flexibility, Decreased range of motion, Decreased strength, Postural dysfunction, Improper body mechanics, Increased muscle spasms, Decreased activity tolerance, Difficulty walking  Visit Diagnosis: Radiculopathy, lumbar region  Muscle weakness (generalized)  Difficulty in walking, not elsewhere classified  Other symptoms and signs involving the musculoskeletal system     Problem List Patient Active Problem List   Diagnosis Date Noted  . Low back pain radiating to left leg 09/21/2017  . History of hepatitis C 02/01/2016  . Hyperlipidemia 09/21/2015  . 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 Osteopenia 09/21/2015  . Vitamin D deficiency 09/21/2015  . Hyperthyroidism 09/21/2015  . Insomnia 08/14/2015  . Anxiety state 08/14/2015    MBess Harvest PTA 10/21/17 6:19 PM   [facility] [address] [place] [address]Phone: [phone]  Fax:  [phone] Name: CEwelina NavesMRN: [id number] of Birth: 9Nov 14, 1972  PHYSICAL THERAPY DISCHARGE SUMMARY  Visits from Start of Care: 4   Current functional level related to goals / functional outcomes:   Unable to formally assess as pt called to cancel all remaining appointments stating MD wanted her to stop PT pending a MRI. Pt has not returned in >30 days, therefore will proceed with discharge from PT for this episode.   Remaining deficits:   As above.   Education / Equipment:   HEP, pTraining and development officereducation  Plan: Patient agrees to discharge.  Patient goals were partially met. Patient is being discharged due  to not returning since the last visit.  ?????     JPercival Spanish PT, MPT 12/12/17, 8:52 AM  [facility] [place] [address]Phone: [phone]  Fax:  [phone]

## 2017-10-22 ENCOUNTER — Encounter: Payer: Self-pay | Admitting: Family Medicine

## 2017-10-22 ENCOUNTER — Ambulatory Visit (INDEPENDENT_AMBULATORY_CARE_PROVIDER_SITE_OTHER): Payer: Managed Care, Other (non HMO) | Admitting: Family Medicine

## 2017-10-22 DIAGNOSIS — M79605 Pain in left leg: Secondary | ICD-10-CM | POA: Diagnosis not present

## 2017-10-22 DIAGNOSIS — M545 Low back pain, unspecified: Secondary | ICD-10-CM

## 2017-10-22 MED ORDER — DICLOFENAC SODIUM 75 MG PO TBEC: 75.0000 mg | DELAYED_RELEASE_TABLET | Freq: Two times a day (BID) | ORAL | 1 refills | Status: DC

## 2017-10-22 NOTE — Patient Instructions (Addendum)
Continue the tylenol. Voltaren twice a day with food for pain and inflammation. We will go ahead with an MRI of your lumbar spine to further assess. You can continue the physical therapy in the meantime (or you can wait on the MRI if you would like).

## 2017-10-24 ENCOUNTER — Encounter: Payer: Self-pay | Admitting: Family Medicine

## 2017-10-24 NOTE — Progress Notes (Signed)
PCP and consultation requested by: Copland, Gay Filler, MD  Subjective:   HPI: Patient is a 47 y.o. female here for Left leg/buttock pain.  5/30: Patient reports for about a year she's had left leg/buttock pain. Pain currently 5/10. States about 4 out of 7 days a week she has pain. Radiates into left thigh down to great toe with numbness. Pain is a shock type of pain. Leg feels weak. Tried prednisone which helped but recurred when stopped this. No skin changes.  7/3: Unfortunately patient feels about the same despite physical therapy, home exercises. Pain level 6/10 and sharp left side of low back, buttock radiating into left leg. Gets cramps in left buttock and hip. Can cause buckling of her knee. She is taking tylenol. Prednisone helped while on this but no lasting relief unfortunately. No bowel/bladder dysfunction.  Past Medical History:  Diagnosis Date  . Cancer [facility])    Breast Cancer  . Hepatitis C   . Urinary tract infection     Current Outpatient Medications on File Prior to Visit  Medication Sig Dispense Refill  . clonazePAM (KLONOPIN) 0.5 MG tablet Take 1 tablet (0.5 mg total) by mouth daily. Use as needed for anxiety (Patient not taking: Reported on 09/10/2017) 30 tablet 0  . predniSONE (DELTASONE) 10 MG tablet 6 tabs po days 1-2, 5 tabs po days 3-4, 4 tabs po days 5-6, 3 tabs po days 7-8, 2 tabs po days 9-10, 1 tab po days 11-12 (Patient not taking: Reported on 10/07/2017) 42 tablet 0  . zolpidem (AMBIEN) 10 MG tablet Take 5 mg by mouth at bedtime as needed for insomnia 45 tablet 1   No current facility-administered medications on file prior to visit.     Past Surgical History:  Procedure Laterality Date  . BREAST BIOPSY  2011  . CHOLECYSTECTOMY  1991  . MASTECTOMY Left 2011  . NECK SURGERY  2007   Neck Fusion; Ruptured Disk  . NOVASURE ABLATION  11/2014  . TONSILLECTOMY  1988  . TUBAL LIGATION      No Known Allergies  Social History   Socioeconomic  History  . Marital status: Married    Spouse name: Not on file  . Number of children: Not on file  . Years of education: Not on file  . Highest education level: Not on file  Occupational History  . Not on file  Social Needs  . Financial resource strain: Not on file  . Food insecurity:    Worry: Not on file    Inability: Not on file  . Transportation needs:    Medical: Not on file    Non-medical: Not on file  Tobacco Use  . Smoking status: Never Smoker  . Smokeless tobacco: Never Used  Substance and Sexual Activity  . Alcohol use: Yes    Alcohol/week: 1.2 oz    Types: 2 Glasses of wine per week    Comment: Occasional  . Drug use: No  . Sexual activity: Yes  Lifestyle  . Physical activity:    Days per week: Not on file    Minutes per session: Not on file  . Stress: Not on file  Relationships  . Social connections:    Talks on phone: Not on file    Gets together: Not on file    Attends religious service: Not on file    Active member of club or organization: Not on file    Attends meetings of clubs or organizations: Not on file  Relationship status: Not on file  . Intimate partner violence:    Fear of current or ex partner: Not on file    Emotionally abused: Not on file    Physically abused: Not on file    Forced sexual activity: Not on file  Other Topics Concern  . Not on file  Social History Narrative  . Not on file    Family History  Problem Relation Age of Onset  . Heart disease Mother   . Heart disease Father   . Heart attack Father   . Diabetes Neg Hx     BP 128/86   Pulse 74   Ht 5' 4"  (1.626 m)   Wt 160 lb (72.6 kg)   BMI 27.46 kg/m   Review of Systems: See HPI above.     Objective:  Physical Exam:  Gen: NAD, comfortable in exam room  Back: No gross deformity, scoliosis. TTP left lumbar paraspinal region and buttock.  No midline or bony TTP. FROM. Strength LEs 5/5 all muscle groups.   2+ MSRs in patellar and achilles tendons, equal  bilaterally. Negative SLRs. Sensation intact to light touch bilaterally.  Left hip: No deformity. FROM with 5/5 strength. No tenderness to palpation. NVI distally. Negative logroll Negative fabers and piriformis stretches.   Assessment & Plan:  1. Low back pain with radiation into left leg - consistent with lumbar radiculopathy.  Exam is reassuring.  Has completed ~6 weeks of conservative treatment including PT, home exercises, prednisone x 2 without much benefit.  She will continue tylenol, start voltaren twice a day and we will go ahead with MRI to assess for disc herniation to the left.  Continue physical therapy in meantime.

## 2017-10-24 NOTE — Assessment & Plan Note (Signed)
consistent with lumbar radiculopathy.  Exam is reassuring.  Has completed ~6 weeks of conservative treatment including PT, home exercises, prednisone x 2 without much benefit.  She will continue tylenol, start voltaren twice a day and we will go ahead with MRI to assess for disc herniation to the left.  Continue physical therapy in meantime.

## 2017-10-28 ENCOUNTER — Ambulatory Visit: Payer: Managed Care, Other (non HMO) | Admitting: Physical Therapy

## 2017-10-29 NOTE — Addendum Note (Signed)
Addended by: Sherrie George F on: 10/29/2017 01:19 PM   Modules accepted: Orders

## 2017-10-30 ENCOUNTER — Encounter: Payer: Self-pay | Admitting: Family Medicine

## 2017-10-30 ENCOUNTER — Ambulatory Visit: Payer: Managed Care, Other (non HMO)

## 2017-11-04 ENCOUNTER — Ambulatory Visit: Payer: Managed Care, Other (non HMO) | Admitting: Physical Therapy

## 2017-11-06 ENCOUNTER — Ambulatory Visit: Payer: Managed Care, Other (non HMO) | Admitting: Physical Therapy

## 2017-11-06 NOTE — Telephone Encounter (Signed)
Patient dropping off form to be completed. Please call patient to pick up once completed. Form up front in Copland tray.

## 2017-11-10 ENCOUNTER — Ambulatory Visit (INDEPENDENT_AMBULATORY_CARE_PROVIDER_SITE_OTHER): Payer: Managed Care, Other (non HMO)

## 2017-11-10 DIAGNOSIS — M545 Low back pain, unspecified: Secondary | ICD-10-CM

## 2017-11-10 DIAGNOSIS — M5116 Intervertebral disc disorders with radiculopathy, lumbar region: Secondary | ICD-10-CM

## 2017-11-10 DIAGNOSIS — M79605 Pain in left leg: Principal | ICD-10-CM

## 2017-11-10 NOTE — Telephone Encounter (Signed)
Patient submitted [employer] Accident Claim Form. After reviewing patient's chart, there is no evidence of an accident noted in pt's EMR. Spoke with Dr. Lorelei Pont about this matter and explained that the form was for accident insurance only; she suggest we call the patient. Called patient and in casual conversation about the form dropped off and the diagnosis assigned, "if she was aware of what originally caused the pain?'". She stated, "No, she just woke up one morning and it was there". I explained to patient that unfortunately if there had not been an accident preceding the diagnosis that we could not complete the form, as we would not have the information to complete the form, as it requires information regarding the "accident". Patient understood and was unsure if the form could be submitted, as she stated that she had known only that "it was a secondary insurance form". I encouraged her to look on the [facility] website, as there are many things that they do cover, such as /emergency dental work; pt understood & agreed/SLS

## 2017-11-11 ENCOUNTER — Ambulatory Visit: Payer: Managed Care, Other (non HMO)

## 2017-11-13 ENCOUNTER — Other Ambulatory Visit: Payer: Self-pay | Admitting: Family Medicine

## 2017-11-13 ENCOUNTER — Ambulatory Visit: Payer: Managed Care, Other (non HMO)

## 2017-11-13 DIAGNOSIS — M5416 Radiculopathy, lumbar region: Secondary | ICD-10-CM

## 2017-11-18 ENCOUNTER — Ambulatory Visit: Payer: Managed Care, Other (non HMO) | Admitting: Physical Therapy

## 2017-11-20 ENCOUNTER — Ambulatory Visit: Payer: Managed Care, Other (non HMO) | Admitting: Physical Therapy

## 2017-11-28 ENCOUNTER — Other Ambulatory Visit: Payer: Self-pay | Admitting: Family Medicine

## 2017-11-28 ENCOUNTER — Ambulatory Visit
Admission: RE | Admit: 2017-11-28 | Discharge: 2017-11-28 | Disposition: A | Discharge status: Home / Self Care | Payer: Managed Care, Other (non HMO) | Source: Ambulatory Visit | Attending: Family Medicine | Admitting: Family Medicine

## 2017-11-28 DIAGNOSIS — M5416 Radiculopathy, lumbar region: Secondary | ICD-10-CM

## 2017-11-28 MED ORDER — IOPAMIDOL (ISOVUE-M 200) INJECTION 41%
1.0000 mL | Freq: Once | INTRAMUSCULAR | Status: AC
  Administered 2017-11-28: 1 mL via EPIDURAL

## 2017-11-28 MED ORDER — METHYLPREDNISOLONE ACETATE 40 MG/ML INJ SUSP (RADIOLOG
120.0000 mg | Freq: Once | INTRAMUSCULAR | Status: AC
  Administered 2017-11-28: 120 mg via EPIDURAL

## 2017-11-28 NOTE — Discharge Instructions (Signed)

## 2017-12-04 ENCOUNTER — Encounter: Payer: Self-pay | Admitting: Family Medicine

## 2017-12-24 ENCOUNTER — Other Ambulatory Visit: Payer: Self-pay | Admitting: Family Medicine

## 2017-12-24 DIAGNOSIS — F411 Generalized anxiety disorder: Secondary | ICD-10-CM

## 2017-12-25 ENCOUNTER — Encounter: Payer: Self-pay | Admitting: Family Medicine

## 2017-12-25 NOTE — Telephone Encounter (Signed)
Requesting:Clonazepam Contract:09/24/17 signed in sports med [id number] Last Visit:09/10/17 Next Visit:none Last Refill:10/16/15  Please Advise

## 2018-02-02 ENCOUNTER — Encounter: Payer: Self-pay | Admitting: Family Medicine

## 2018-02-03 ENCOUNTER — Other Ambulatory Visit: Payer: Self-pay | Admitting: Family Medicine

## 2018-02-03 DIAGNOSIS — G8929 Other chronic pain: Secondary | ICD-10-CM

## 2018-02-03 DIAGNOSIS — M545 Low back pain, unspecified: Secondary | ICD-10-CM

## 2018-02-10 ENCOUNTER — Ambulatory Visit (INDEPENDENT_AMBULATORY_CARE_PROVIDER_SITE_OTHER): Payer: Managed Care, Other (non HMO) | Admitting: Family Medicine

## 2018-02-10 VITALS — BP 138/92 | HR 73 | Ht 64.0 in | Wt 160.0 lb

## 2018-02-10 DIAGNOSIS — M545 Low back pain, unspecified: Secondary | ICD-10-CM

## 2018-02-10 DIAGNOSIS — M79605 Pain in left leg: Secondary | ICD-10-CM | POA: Diagnosis not present

## 2018-02-11 ENCOUNTER — Encounter: Payer: Self-pay | Admitting: Family Medicine

## 2018-02-11 NOTE — Progress Notes (Addendum)
PCP and consultation requested by: Copland, Gay Filler, MD  Subjective:   HPI: Patient is a 47 y.o. female here for Left leg/buttock pain.  5/30: Patient reports for about a year she's had left leg/buttock pain. Pain currently 5/10. States about 4 out of 7 days a week she has pain. Radiates into left thigh down to great toe with numbness. Pain is a shock type of pain. Leg feels weak. Tried prednisone which helped but recurred when stopped this. No skin changes.  7/3: Unfortunately patient feels about the same despite physical therapy, home exercises. Pain level 6/10 and sharp left side of low back, buttock radiating into left leg. Gets cramps in left buttock and hip. Can cause buckling of her knee. She is taking tylenol. Prednisone helped while on this but no lasting relief unfortunately. No bowel/bladder dysfunction.  10/22: Patient reports she's struggling with low back pain radiating into the left leg. Injection provided excellent relief, felt like it resolved pain until about 2 weeks ago. Pain level 6/10 and sharp in same distribution as prior pain. Not as active as she was due to pain - cannot run. Has been doing home exercises since last visit 3.5 months ago, taking tylenol and using lidocaine patches. No bowel/bladder dysfunction.  Past Medical History:  Diagnosis Date  . Cancer [facility])    Breast Cancer  . Hepatitis C   . Urinary tract infection     Current Outpatient Medications on File Prior to Visit  Medication Sig Dispense Refill  . clonazePAM (KLONOPIN) 0.5 MG tablet Take 1 tablet (0.5 mg total) by mouth daily. Use as needed for anxiety (Patient not taking: Reported on 09/10/2017) 30 tablet 0  . diclofenac (VOLTAREN) 75 MG EC tablet Take 1 tablet (75 mg total) by mouth 2 (two) times daily. 60 tablet 1  . predniSONE (DELTASONE) 10 MG tablet 6 tabs po days 1-2, 5 tabs po days 3-4, 4 tabs po days 5-6, 3 tabs po days 7-8, 2 tabs po days 9-10, 1 tab po days 11-12  (Patient not taking: Reported on 10/07/2017) 42 tablet 0  . zolpidem (AMBIEN) 10 MG tablet Take 5 mg by mouth at bedtime as needed for insomnia 45 tablet 1   No current facility-administered medications on file prior to visit.     Past Surgical History:  Procedure Laterality Date  . BREAST BIOPSY  2011  . CHOLECYSTECTOMY  1991  . MASTECTOMY Left 2011  . NECK SURGERY  2007   Neck Fusion; Ruptured Disk  . NOVASURE ABLATION  11/2014  . TONSILLECTOMY  1988  . TUBAL LIGATION      No Known Allergies  Social History   Socioeconomic History  . Marital status: Married    Spouse name: Not on file  . Number of children: Not on file  . Years of education: Not on file  . Highest education level: Not on file  Occupational History  . Not on file  Social Needs  . Financial resource strain: Not on file  . Food insecurity:    Worry: Not on file    Inability: Not on file  . Transportation needs:    Medical: Not on file    Non-medical: Not on file  Tobacco Use  . Smoking status: Never Smoker  . Smokeless tobacco: Never Used  Substance and Sexual Activity  . Alcohol use: Yes    Alcohol/week: 2.0 standard drinks    Types: 2 Glasses of wine per week    Comment: Occasional  .  Drug use: No  . Sexual activity: Yes  Lifestyle  . Physical activity:    Days per week: Not on file    Minutes per session: Not on file  . Stress: Not on file  Relationships  . Social connections:    Talks on phone: Not on file    Gets together: Not on file    Attends religious service: Not on file    Active member of club or organization: Not on file    Attends meetings of clubs or organizations: Not on file    Relationship status: Not on file  . Intimate partner violence:    Fear of current or ex partner: Not on file    Emotionally abused: Not on file    Physically abused: Not on file    Forced sexual activity: Not on file  Other Topics Concern  . Not on file  Social History Narrative  . Not on file     Family History  Problem Relation Age of Onset  . Heart disease Mother   . Heart disease Father   . Heart attack Father   . Diabetes Neg Hx     There were no vitals taken for this visit.  Review of Systems: See HPI above.     Objective:  Physical Exam:  Gen: NAD, comfortable in exam room  Back: No gross deformity, scoliosis. TTP left lumbar paraspinal region, buttock.  No midline or bony TTP. FROM with pain on flexion. Strength LEs 5/5 all muscle groups.   2+ MSRs in patellar and achilles tendons, equal bilaterally. Negative SLRs. Sensation intact to light touch bilaterally. Negative logroll bilateral hips Negative fabers and piriformis stretches.   Assessment & Plan:  1. Low back pain with radiation into left leg - 2/2 lumbar radiculopathy.  Had excellent relief from ESI at L4-5 with left L4 neve root block that lasted 3 months and improved her function over that time.  Has continued doing home exercises.  Decreased ability to exercise due to pain.  Has not responded previously to PT, prednisone x 2.  Will go ahead with repeat ESI.  If pain recurs after this would consider neurosurgery referral.  Tylenol, voltaren as needed.

## 2018-02-16 ENCOUNTER — Inpatient Hospital Stay
Admission: RE | Admit: 2018-02-16 | Discharge: 2018-02-16 | Disposition: A | Discharge status: Home / Self Care | Payer: Managed Care, Other (non HMO) | Source: Ambulatory Visit | Attending: Family Medicine | Admitting: Family Medicine

## 2018-02-25 ENCOUNTER — Encounter: Payer: Self-pay | Admitting: Radiology

## 2018-02-25 ENCOUNTER — Ambulatory Visit
Admission: RE | Admit: 2018-02-25 | Discharge: 2018-02-25 | Disposition: A | Discharge status: Home / Self Care | Payer: Managed Care, Other (non HMO) | Source: Ambulatory Visit | Attending: Family Medicine | Admitting: Family Medicine

## 2018-02-25 DIAGNOSIS — G8929 Other chronic pain: Secondary | ICD-10-CM

## 2018-02-25 DIAGNOSIS — M545 Low back pain, unspecified: Secondary | ICD-10-CM

## 2018-02-25 MED ORDER — METHYLPREDNISOLONE ACETATE 40 MG/ML INJ SUSP (RADIOLOG
120.0000 mg | Freq: Once | INTRAMUSCULAR | Status: AC
  Administered 2018-02-25: 120 mg via EPIDURAL

## 2018-02-25 MED ORDER — IOPAMIDOL (ISOVUE-M 200) INJECTION 41%
1.0000 mL | Freq: Once | INTRAMUSCULAR | Status: AC
  Administered 2018-02-25: 1 mL via EPIDURAL

## 2018-02-25 NOTE — Discharge Instructions (Signed)

## 2018-03-15 ENCOUNTER — Encounter: Payer: Self-pay | Admitting: Family Medicine

## 2018-03-15 DIAGNOSIS — G4700 Insomnia, unspecified: Secondary | ICD-10-CM

## 2018-03-16 MED ORDER — ZOLPIDEM TARTRATE 10 MG PO TABS: ORAL_TABLET | ORAL | 1 refills | Status: DC

## 2018-05-13 ENCOUNTER — Ambulatory Visit: Payer: Managed Care, Other (non HMO) | Admitting: Family Medicine

## 2018-09-14 ENCOUNTER — Encounter: Payer: Self-pay | Admitting: Family Medicine

## 2018-09-14 DIAGNOSIS — G4700 Insomnia, unspecified: Secondary | ICD-10-CM

## 2018-09-15 MED ORDER — ZOLPIDEM TARTRATE 10 MG PO TABS: ORAL_TABLET | ORAL | 0 refills | Status: DC

## 2018-10-11 NOTE — Progress Notes (Addendum)
[facility] [address] [place], [place] [address] [phone]  Date:  10/12/2018   Name:  Shirley Holder   DOB:  1970/05/11   MRN:  [id number]  PCP:  Darreld Mclean, MD    Chief Complaint: No chief complaint on file.   History of Present Illness:  Shirley Holder is a 48 y.o. 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  She now would like to possibly have her implant removed or reduced, it seems too large to her now  She saw Dr. Percell Miller with plastic surgery years ago   Mammo: 2017, now due Pap: 5/18- wnl Labs: now due Immun:  UTD   Patient Active Problem List   Diagnosis Date Noted  . Low back pain radiating to left leg 09/21/2017  . History of hepatitis C 02/01/2016  . Hyperlipidemia 09/21/2015  . 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 Osteopenia 09/21/2015  . Vitamin D deficiency 09/21/2015  . Hyperthyroidism 09/21/2015  . Insomnia 08/14/2015  . Anxiety state 08/14/2015    Past Medical History:  Diagnosis Date  . Cancer [facility])    Breast Cancer  . Hepatitis C   . Urinary tract infection     Past Surgical History:  Procedure Laterality Date  . BREAST BIOPSY  2011  . CHOLECYSTECTOMY  1991  . MASTECTOMY Left 2011  . NECK SURGERY  2007   Neck Fusion; Ruptured Disk  . NOVASURE ABLATION  11/2014  . TONSILLECTOMY  1988  . TUBAL LIGATION       Social History   Tobacco Use  . Smoking status: Never Smoker  . Smokeless tobacco: Never Used  Substance Use Topics  . Alcohol use: Yes    Alcohol/week: 2.0 standard drinks    Types: 2 Glasses of wine per week    Comment: Occasional  . Drug use: No    Family History  Problem Relation Age of Onset  . Heart disease Mother   . Heart disease Father   . Heart attack Father   . Diabetes Neg Hx     No Known Allergies  Medication list has been reviewed and updated.  Current Outpatient Medications on File Prior to Visit  Medication Sig Dispense Refill  . diclofenac (VOLTAREN) 75 MG EC tablet Take 1 tablet (75 mg total) by mouth 2 (two) times daily. 60 tablet 1  . zolpidem (AMBIEN) 10 MG tablet Take 5 mg by mouth at bedtime as needed for insomnia. This is a 90 day supply 15 tablet 0   No current facility-administered medications on file prior to visit.     Review of Systems:  As per HPI- otherwise negative. No CP or SOB No fever or chills   Physical Examination: Vitals:   10/12/18 1409  BP: 137/81  Pulse: 72  Resp: 16  Temp: 98.1 F (36.7 C)  SpO2: 99%   Vitals:   10/12/18 1409  Weight: 158 lb (71.7 kg)  Height: 5' 2"  (1.575 m)   Body mass index is 28.9 kg/m. Ideal Body Weight: Weight in (lb) to have BMI = 25: 136.4  GEN: WDWN, NAD, Non-toxic, A & O x 3, minimal overweight, looks well  HEENT: Atraumatic, Normocephalic. Neck supple. No masses, No LAD. Ears and Nose: No external deformity. CV: RRR, No M/G/R. No JVD. No thrill. No extra heart sounds. PULM: CTA B, no wheezes, crackles, rhonchi. No retractions. No resp. distress. No accessory muscle use. ABD: S, NT, ND No rebound. No HSM. EXTR: No c/c/e NEURO Normal gait.  PSYCH: Normally interactive. Conversant. Not depressed or anxious appearing.  Calm demeanor.  Breast exam: s/p left mastectomy with implant in place, seems normal S/p right reduction- no masses or other concerning changes noted     Assessment and Plan:   ICD-10-CM   1. Physical exam  Z00.00   2. Screening for diabetes mellitus  Z13.1 Comprehensive metabolic panel    Hemoglobin A1c  3. Screening for hyperlipidemia  Z13.220 Lipid panel  4. Screening for deficiency anemia  Z13.0 CBC  5. History of ductal carcinoma in situ (DCIS) of breast  Z86.000 Ambulatory referral to Plastic Surgery    MM 3D SCREEN BREAST UNI RIGHT    CANCELED: MM 3D SCREEN BREAST BILATERAL  6. Screening for HIV (human immunodeficiency virus)  Z11.4 HIV Antibody (routine testing w rflx)  7. Insomnia, unspecified type  G47.00 zolpidem (AMBIEN) 10 MG tablet  8. History of hepatitis C  Z86.19 CANCELED: HCV RNA quant     Follow-up: No follow-ups on file.  Meds ordered this encounter  Medications  . zolpidem (AMBIEN) 10 MG tablet    Sig: Take 5 mg by mouth at bedtime as needed for insomnia.    Dispense:  15 tablet    Refill:  2   Orders Placed This Encounter  Procedures  . MM 3D SCREEN BREAST UNI RIGHT  . CBC  . Comprehensive metabolic panel  . Hemoglobin A1c  . Lipid panel  . HIV Antibody (routine testing w rflx)  . Ambulatory referral to Plastic Surgery    CPE today- doing overall well She will go back to her original plastic surgeon to discuss her breast concerns Set up mammo Labs pending Would like to make sure her hep C is still negative    Signed Lamar Blinks, MD  Received her labs 6/23- message to pt  Results for orders placed or performed in visit on 10/12/18  CBC  Result Value Ref Range   WBC 6.0 4.0 - 10.5 K/uL   RBC 4.75 3.87 - 5.11 Mil/uL   Platelets 215.0 150.0 - 400.0 K/uL   Hemoglobin 14.0 12.0 - 15.0 g/dL   HCT 42.3 36.0 - 46.0 %   MCV 89.0 78.0 - 100.0 fl   MCHC 33.1 30.0 - 36.0 g/dL   RDW 13.5 11.5 - 15.5 %  Comprehensive metabolic panel  Result Value Ref Range   Sodium 137 135 - 145 mEq/L   Potassium 4.2 3.5 - 5.1 mEq/L   Chloride 102 96 - 112 mEq/L   CO2 30 19 - 32 mEq/L   Glucose, Bld 83  70 - 99 mg/dL   BUN 11 6 - 23 mg/dL   Creatinine, Ser 0.62 0.40 - 1.20 mg/dL   Total Bilirubin 0.6 0.2 - 1.2 mg/dL   Alkaline Phosphatase 39 39 - 117 U/L   AST 14 0 -  37 U/L   ALT 13 0 - 35 U/L   Total Protein 6.8 6.0 - 8.3 g/dL   Albumin 4.2 3.5 - 5.2 g/dL   Calcium 9.2 8.4 - 10.5 mg/dL   GFR 102.40 >60.00 mL/min  Hemoglobin A1c  Result Value Ref Range   Hgb A1c MFr Bld 5.0 4.6 - 6.5 %  Lipid panel  Result Value Ref Range   Cholesterol 178 0 - 200 mg/dL   Triglycerides 69.0 0.0 - 149.0 mg/dL   HDL 61.30 >39.00 mg/dL   VLDL 13.8 0.0 - 40.0 mg/dL   LDL Cholesterol 103 (H) 0 - 99 mg/dL   Total CHOL/HDL Ratio 3    NonHDL 117.00   HIV Antibody (routine testing w rflx)  Result Value Ref Range   HIV 1&2 Ab, 4th Generation NON-REACTIVE NON-REACTI

## 2018-10-12 ENCOUNTER — Ambulatory Visit (INDEPENDENT_AMBULATORY_CARE_PROVIDER_SITE_OTHER): Payer: Managed Care, Other (non HMO) | Admitting: Family Medicine

## 2018-10-12 ENCOUNTER — Encounter: Payer: Self-pay | Admitting: Family Medicine

## 2018-10-12 ENCOUNTER — Other Ambulatory Visit: Payer: Self-pay

## 2018-10-12 VITALS — BP 137/81 | HR 72 | Temp 98.1°F | Resp 16 | Ht 62.0 in | Wt 158.0 lb

## 2018-10-12 DIAGNOSIS — Z0000 Encounter for general adult medical examination without abnormal findings: Secondary | ICD-10-CM | POA: Diagnosis not present

## 2018-10-12 DIAGNOSIS — Z114 Encounter for screening for human immunodeficiency virus [HIV]: Secondary | ICD-10-CM

## 2018-10-12 DIAGNOSIS — Z130 Encounter for screening for diseases of the blood and blood-forming organs and certain disorders involving the immune mechanism: Secondary | ICD-10-CM

## 2018-10-12 DIAGNOSIS — Z86000 Personal history of in-situ neoplasm of breast: Secondary | ICD-10-CM

## 2018-10-12 DIAGNOSIS — Z8619 Personal history of other infectious and parasitic diseases: Secondary | ICD-10-CM

## 2018-10-12 DIAGNOSIS — Z13220 Encounter for screening for lipoid disorders: Secondary | ICD-10-CM | POA: Diagnosis not present

## 2018-10-12 DIAGNOSIS — G4700 Insomnia, unspecified: Secondary | ICD-10-CM

## 2018-10-12 DIAGNOSIS — Z131 Encounter for screening for diabetes mellitus: Secondary | ICD-10-CM | POA: Diagnosis not present

## 2018-10-12 MED ORDER — ZOLPIDEM TARTRATE 10 MG PO TABS: ORAL_TABLET | ORAL | 2 refills | Status: DC

## 2018-10-12 NOTE — Patient Instructions (Addendum)
Great to see you today- take care and I will be in touch with your labs Dr Percell Miller is still practicing-I will put in a referral for you, but you can also certainly call and set up a consultation  [address] [place], [place] [address] Telephone: [phone] FAX: [phone] E-mail: [email or web address]  We refilled your Lorrin Mais today- continue to use sparingly  Health Maintenance, Female Adopting a healthy lifestyle and getting preventive care can go a long way to promote health and wellness. Talk with your health care provider about what schedule of regular examinations is right for you. This is a good chance for you to check in with your provider about disease prevention and staying healthy. In between checkups, there are plenty of things you can do on your own. Experts have done a lot of research about which lifestyle changes and preventive measures are most likely to keep you healthy. Ask your health care provider for more information. Weight and diet Eat a healthy diet  Be sure to include plenty of vegetables, fruits, low-fat dairy products, and lean protein.  Do not eat a lot of foods high in solid fats, added sugars, or salt.  Get regular exercise. This is one of the most important things you can do for your health. ? Most adults should exercise for at least 150 minutes each week. The exercise should increase your heart rate and make you sweat (moderate-intensity exercise). ? Most adults should also do strengthening exercises at least twice a week. This is in addition to the moderate-intensity exercise. Maintain a healthy weight  Body mass index (BMI) is a measurement that can be used to identify possible weight problems. It estimates body fat based on height and weight. Your health care provider can help determine your BMI and help you achieve or maintain a healthy weight.  For females 67 years of age and older: ? A BMI  below 18.5 is considered underweight. ? A BMI of 18.5 to 24.9 is normal. ? A BMI of 25 to 29.9 is considered overweight. ? A BMI of 30 and above is considered obese. Watch levels of cholesterol and blood lipids  You should start having your blood tested for lipids and cholesterol at 49 years of age, then have this test every 5 years.  You may need to have your cholesterol levels checked more often if: ? Your lipid or cholesterol levels are high. ? You are older than 49 years of age. ? You are at high risk for heart disease. Cancer screening Lung Cancer  Lung cancer screening is recommended for adults 82-39 years old who are at high risk for lung cancer because of a history of smoking.  A yearly low-dose CT scan of the lungs is recommended for people who: ? Currently smoke. ? Have quit within the past 15 years. ? Have at least a 30-pack-year history of smoking. A pack year is smoking an average of one pack of cigarettes a day for 1 year.  Yearly screening should continue until it has been 15 years since you quit.  Yearly screening should stop if you develop a health problem that would prevent you from having lung cancer treatment. Breast Cancer  Practice breast self-awareness. This means understanding how your breasts normally appear and feel.  It also means doing regular breast self-exams. Let your health care provider know about any changes, no matter how small.  If you are in your 14s  or 34s, you should have a clinical breast exam (CBE) by a health care provider every 1-3 years as part of a regular health exam.  If you are 67 or older, have a CBE every year. Also consider having a breast X-ray (mammogram) every year.  If you have a family history of breast cancer, talk to your health care provider about genetic screening.  If you are at high risk for breast cancer, talk to your health care provider about having an MRI and a mammogram every year.  Breast cancer gene (BRCA)  assessment is recommended for women who have family members with BRCA-related cancers. BRCA-related cancers include: ? Breast. ? Ovarian. ? Tubal. ? Peritoneal cancers.  Results of the assessment will determine the need for genetic counseling and BRCA1 and BRCA2 testing. Cervical Cancer Your health care provider may recommend that you be screened regularly for cancer of the pelvic organs (ovaries, uterus, and vagina). This screening involves a pelvic examination, including checking for microscopic changes to the surface of your cervix (Pap test). You may be encouraged to have this screening done every 3 years, beginning at age 78.  For women ages 62-65, health care providers may recommend pelvic exams and Pap testing every 3 years, or they may recommend the Pap and pelvic exam, combined with testing for human papilloma virus (HPV), every 5 years. Some types of HPV increase your risk of cervical cancer. Testing for HPV may also be done on women of any age with unclear Pap test results.  Other health care providers may not recommend any screening for nonpregnant women who are considered low risk for pelvic cancer and who do not have symptoms. Ask your health care provider if a screening pelvic exam is right for you.  If you have had past treatment for cervical cancer or a condition that could lead to cancer, you need Pap tests and screening for cancer for at least 20 years after your treatment. If Pap tests have been discontinued, your risk factors (such as having a new sexual partner) need to be reassessed to determine if screening should resume. Some women have medical problems that increase the chance of getting cervical cancer. In these cases, your health care provider may recommend more frequent screening and Pap tests. Colorectal Cancer  This type of cancer can be detected and often prevented.  Routine colorectal cancer screening usually begins at 49 years of age and continues through 49 years  of age.  Your health care provider may recommend screening at an earlier age if you have risk factors for colon cancer.  Your health care provider may also recommend using home test kits to check for hidden blood in the stool.  A small camera at the end of a tube can be used to examine your colon directly (sigmoidoscopy or colonoscopy). This is done to check for the earliest forms of colorectal cancer.  Routine screening usually begins at age 39.  Direct examination of the colon should be repeated every 5-10 years through 49 years of age. However, you may need to be screened more often if early forms of precancerous polyps or small growths are found. Skin Cancer  Check your skin from head to toe regularly.  Tell your health care provider about any new moles or changes in moles, especially if there is a change in a mole's shape or color.  Also tell your health care provider if you have a mole that is larger than the size of a pencil eraser.  Always use sunscreen. Apply sunscreen liberally and repeatedly throughout the day.  Protect yourself by wearing long sleeves, pants, a wide-brimmed hat, and sunglasses whenever you are outside. Heart disease, diabetes, and high blood pressure  High blood pressure causes heart disease and increases the risk of stroke. High blood pressure is more likely to develop in: ? People who have blood pressure in the high end of the normal range (130-139/85-89 mm Hg). ? People who are overweight or obese. ? People who are African American.  If you are 48-57 years of age, have your blood pressure checked every 3-5 years. If you are 19 years of age or older, have your blood pressure checked every year. You should have your blood pressure measured twice-once when you are at a hospital or clinic, and once when you are not at a hospital or clinic. Record the average of the two measurements. To check your blood pressure when you are not at a hospital or clinic, you can  use: ? An automated blood pressure machine at a pharmacy. ? A home blood pressure monitor.  If you are between 66 years and 33 years old, ask your health care provider if you should take aspirin to prevent strokes.  Have regular diabetes screenings. This involves taking a blood sample to check your fasting blood sugar level. ? If you are at a normal weight and have a low risk for diabetes, have this test once every three years after 49 years of age. ? If you are overweight and have a high risk for diabetes, consider being tested at a younger age or more often. Preventing infection Hepatitis B  If you have a higher risk for hepatitis B, you should be screened for this virus. You are considered at high risk for hepatitis B if: ? You were born in a country where hepatitis B is common. Ask your health care provider which countries are considered high risk. ? Your parents were born in a high-risk country, and you have not been immunized against hepatitis B (hepatitis B vaccine). ? You have HIV or AIDS. ? You use needles to inject street drugs. ? You live with someone who has hepatitis B. ? You have had sex with someone who has hepatitis B. ? You get hemodialysis treatment. ? You take certain medicines for conditions, including cancer, organ transplantation, and autoimmune conditions. Hepatitis C  Blood testing is recommended for: ? Everyone born from [birth year] through 1966. ? Anyone with known risk factors for hepatitis C. Sexually transmitted infections (STIs)  You should be screened for sexually transmitted infections (STIs) including gonorrhea and chlamydia if: ? You are sexually active and are younger than 49 years of age. ? You are older than 49 years of age and your health care provider tells you that you are at risk for this type of infection. ? Your sexual activity has changed since you were last screened and you are at an increased risk for chlamydia or gonorrhea. Ask your health care  provider if you are at risk.  If you do not have HIV, but are at risk, it may be recommended that you take a prescription medicine daily to prevent HIV infection. This is called pre-exposure prophylaxis (PrEP). You are considered at risk if: ? You are sexually active and do not regularly use condoms or know the HIV status of your partner(s). ? You take drugs by injection. ? You are sexually active with a partner who has HIV. Talk with your health care provider  about whether you are at high risk of being infected with HIV. If you choose to begin PrEP, you should first be tested for HIV. You should then be tested every 3 months for as long as you are taking PrEP. Pregnancy  If you are premenopausal and you may become pregnant, ask your health care provider about preconception counseling.  If you may become pregnant, take 400 to 800 micrograms (mcg) of folic acid every day.  If you want to prevent pregnancy, talk to your health care provider about birth control (contraception). Osteoporosis and menopause  Osteoporosis is a disease in which the bones lose minerals and strength with aging. This can result in serious bone fractures. Your risk for osteoporosis can be identified using a bone density scan.  If you are 37 years of age or older, or if you are at risk for osteoporosis and fractures, ask your health care provider if you should be screened.  Ask your health care provider whether you should take a calcium or vitamin D supplement to lower your risk for osteoporosis.  Menopause may have certain physical symptoms and risks.  Hormone replacement therapy may reduce some of these symptoms and risks. Talk to your health care provider about whether hormone replacement therapy is right for you. Follow these instructions at home:  Schedule regular health, dental, and eye exams.  Stay current with your immunizations.  Do not use any tobacco products including cigarettes, chewing tobacco, or  electronic cigarettes.  If you are pregnant, do not drink alcohol.  If you are breastfeeding, limit how much and how often you drink alcohol.  Limit alcohol intake to no more than 1 drink per day for nonpregnant women. One drink equals 12 ounces of beer, 5 ounces of wine, or 1 ounces of hard liquor.  Do not use street drugs.  Do not share needles.  Ask your health care provider for help if you need support or information about quitting drugs.  Tell your health care provider if you often feel depressed.  Tell your health care provider if you have ever been abused or do not feel safe at home. This information is not intended to replace advice given to you by your health care provider. Make sure you discuss any questions you have with your health care provider. Document Released: 10/22/2010 Document Revised: 09/14/2015 Document Reviewed: 01/10/2015 Elsevier Interactive Patient Education  2019 [employer].

## 2018-10-13 ENCOUNTER — Encounter: Payer: Self-pay | Admitting: Family Medicine

## 2018-10-13 LAB — LIPID PANEL
Cholesterol: 178 mg/dL (ref 0–200)
HDL: 61.3 mg/dL (ref >39.00)
LDL Cholesterol: 103 mg/dL — ABNORMAL HIGH (ref 0–99)
NonHDL: 117
Total CHOL/HDL Ratio: 3
Triglycerides: 69 mg/dL (ref 0.0–149.0)
VLDL: 13.8 mg/dL (ref 0.0–40.0)

## 2018-10-13 LAB — COMPREHENSIVE METABOLIC PANEL
ALT: 13 U/L (ref 0–35)
AST: 14 U/L (ref 0–37)
Albumin: 4.2 g/dL (ref 3.5–5.2)
Alkaline Phosphatase: 39 U/L (ref 39–117)
BUN: 11 mg/dL (ref 6–23)
CO2: 30 mEq/L (ref 19–32)
Calcium: 9.2 mg/dL (ref 8.4–10.5)
Chloride: 102 mEq/L (ref 96–112)
Creatinine, Ser: 0.62 mg/dL (ref 0.40–1.20)
GFR: 102.4 mL/min (ref >60.00)
Glucose, Bld: 83 mg/dL (ref 70–99)
Potassium: 4.2 mEq/L (ref 3.5–5.1)
Sodium: 137 mEq/L (ref 135–145)
Total Bilirubin: 0.6 mg/dL (ref 0.2–1.2)
Total Protein: 6.8 g/dL (ref 6.0–8.3)

## 2018-10-13 LAB — CBC
HCT: 42.3 % (ref 36.0–46.0)
Hemoglobin: 14 g/dL (ref 12.0–15.0)
MCHC: 33.1 g/dL (ref 30.0–36.0)
MCV: 89 fl (ref 78.0–100.0)
Platelets: 215 10*3/uL (ref 150.0–400.0)
RBC: 4.75 Mil/uL (ref 3.87–5.11)
RDW: 13.5 % (ref 11.5–15.5)
WBC: 6 10*3/uL (ref 4.0–10.5)

## 2018-10-13 LAB — HEMOGLOBIN A1C: Hgb A1c MFr Bld: 5 % (ref 4.6–6.5)

## 2018-10-13 LAB — HIV ANTIBODY (ROUTINE TESTING W REFLEX): HIV 1&2 Ab, 4th Generation: NONREACTIVE

## 2018-11-09 IMAGING — DX DG LUMBAR SPINE COMPLETE 4+V
5 series · 5 of 5 positions shown · non-contrast
Comparison: None.

CLINICAL DATA: Chronic lower back pain without known injury.

EXAM:
LUMBAR SPINE - COMPLETE 4+ VIEW

[l-spine ap]
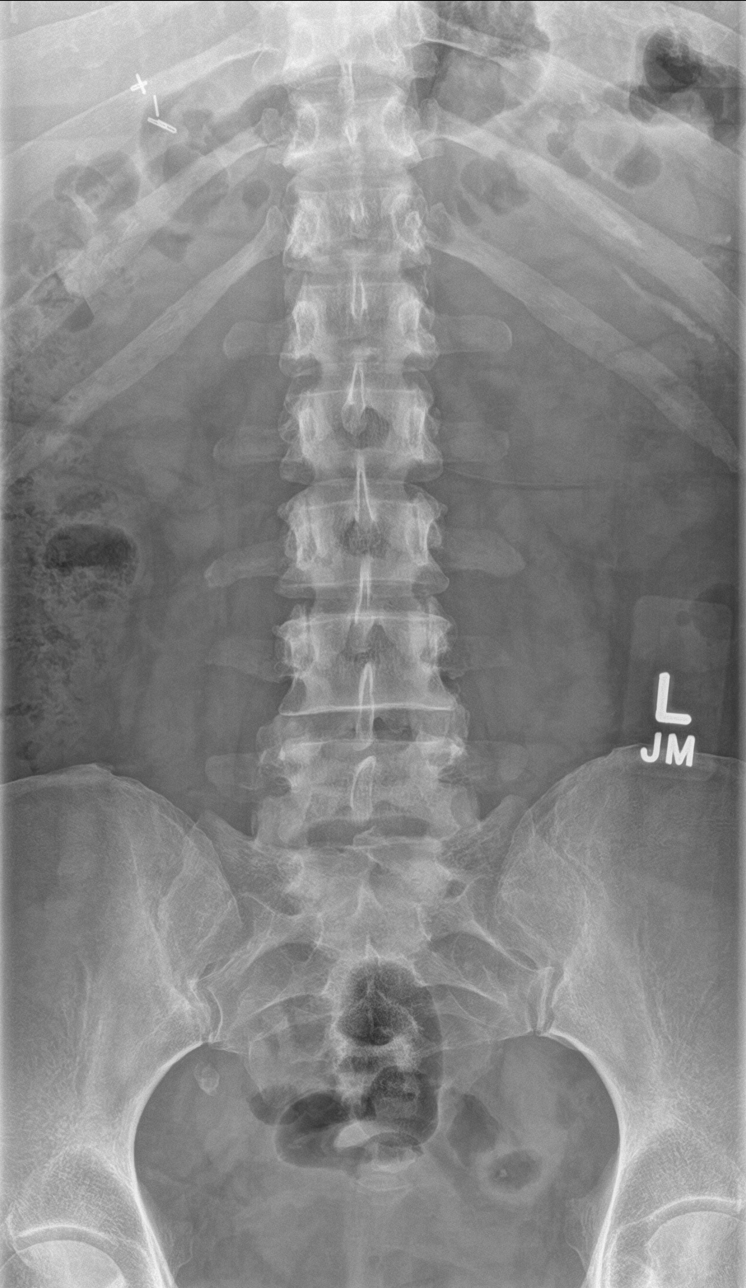

[l-spine obl (1 of 2)]
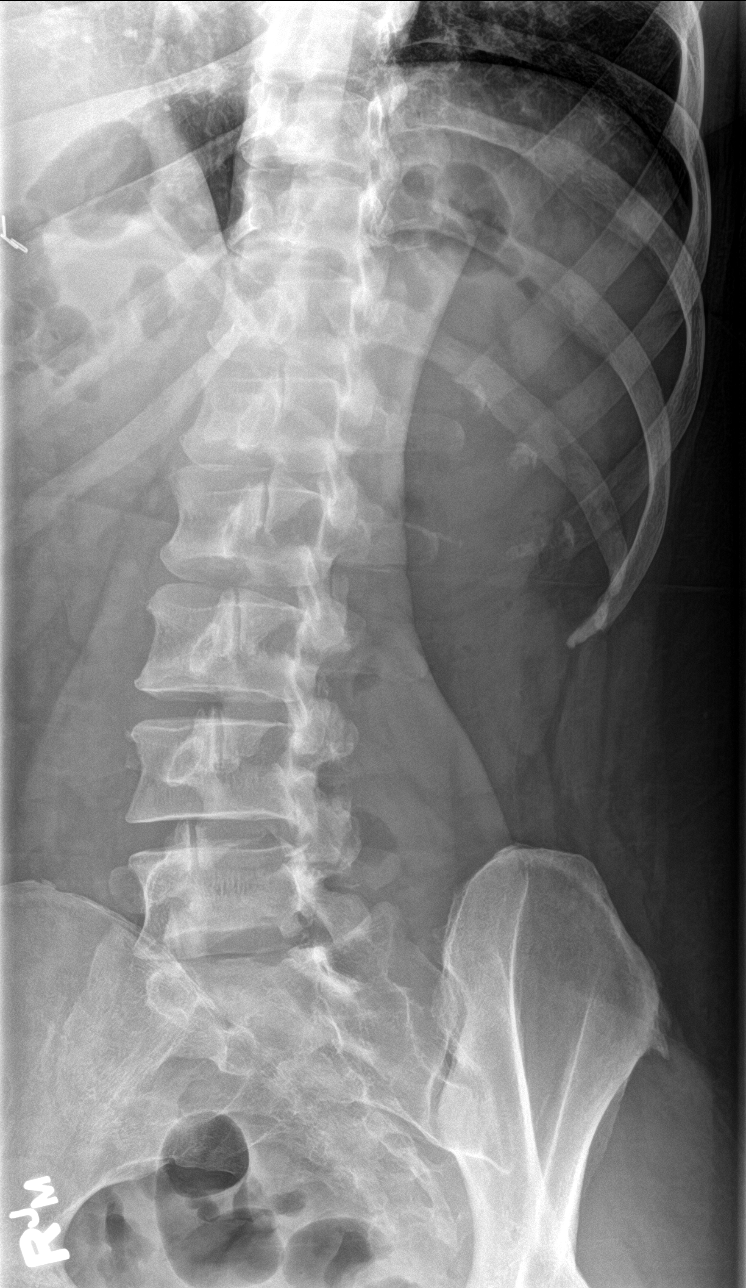

[l-spine obl (2 of 2)]
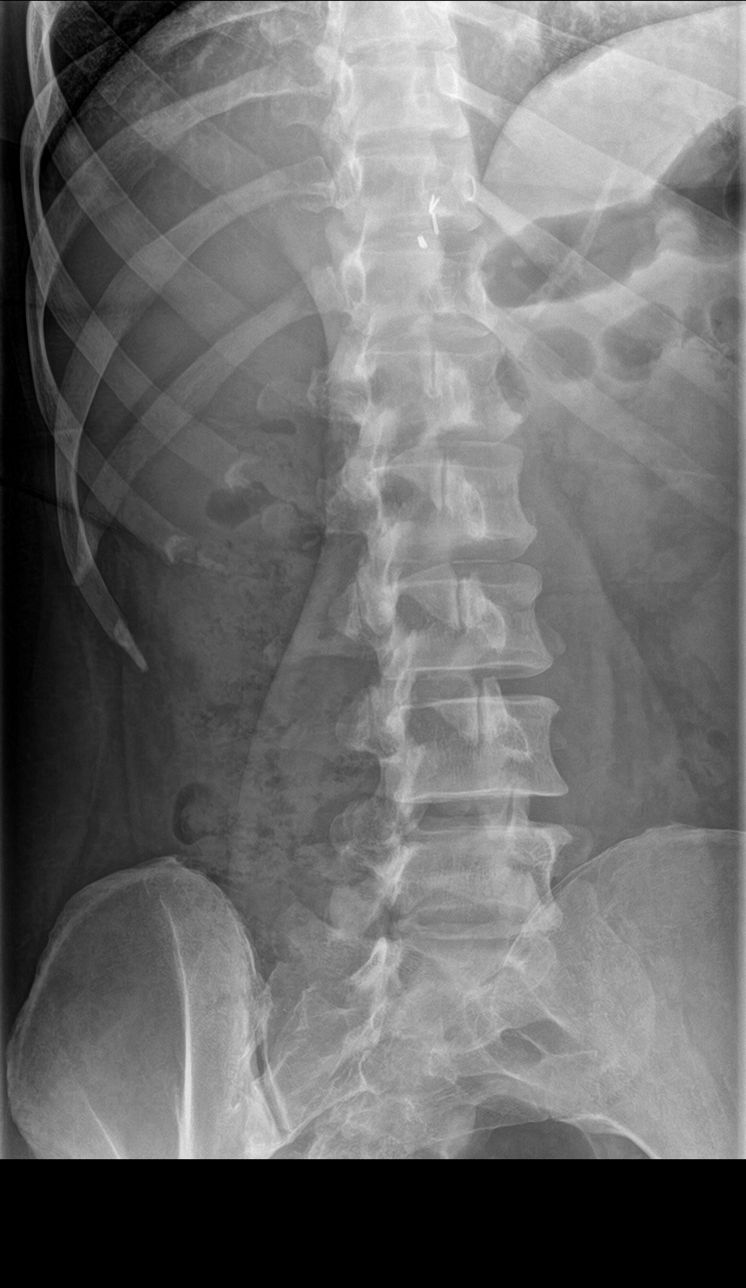

[l-spine lat]
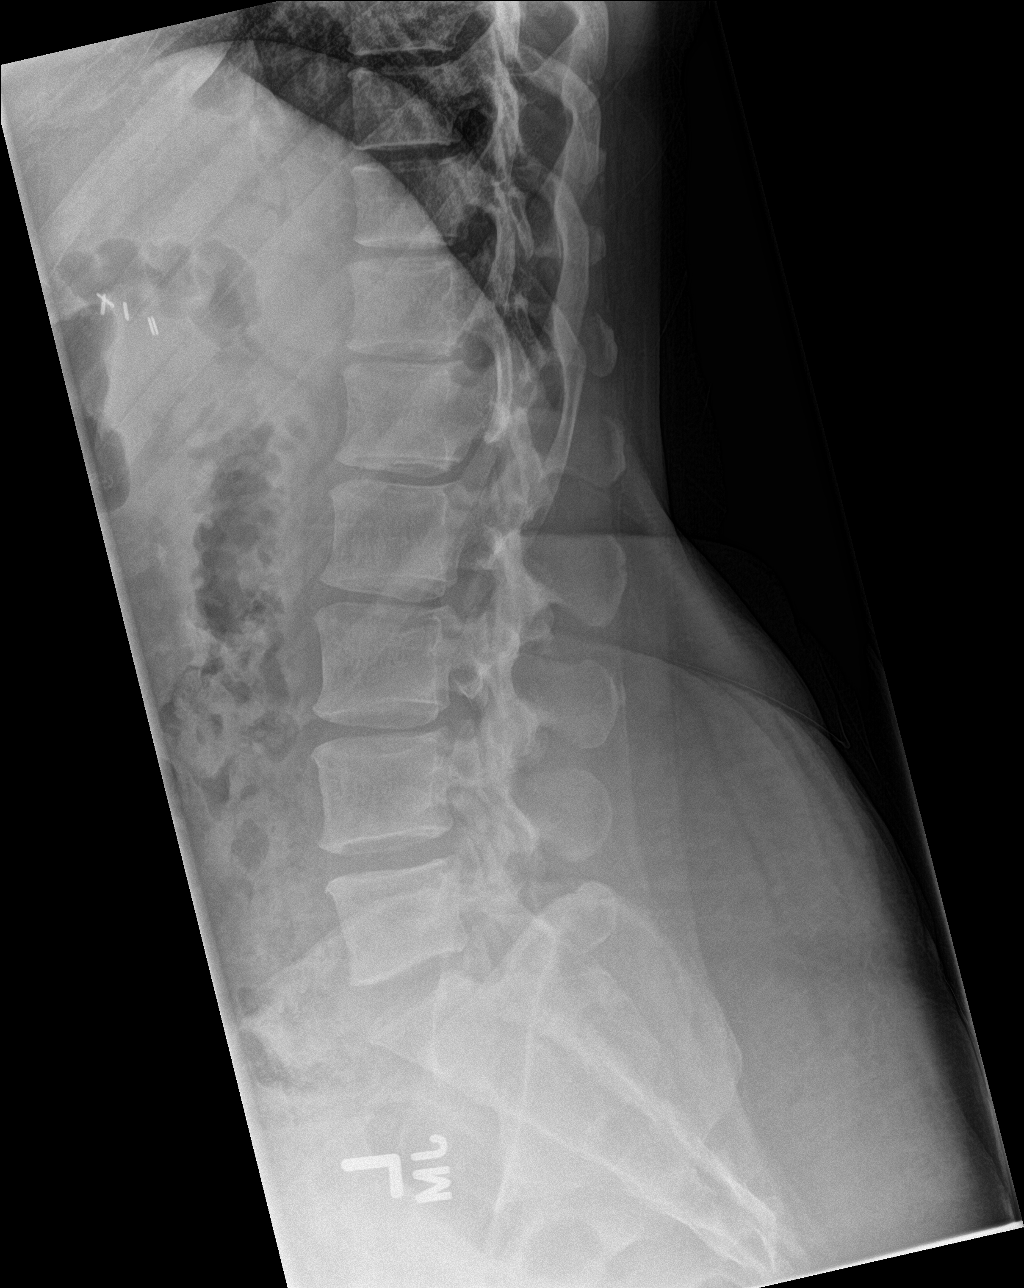

[l-spine spot]
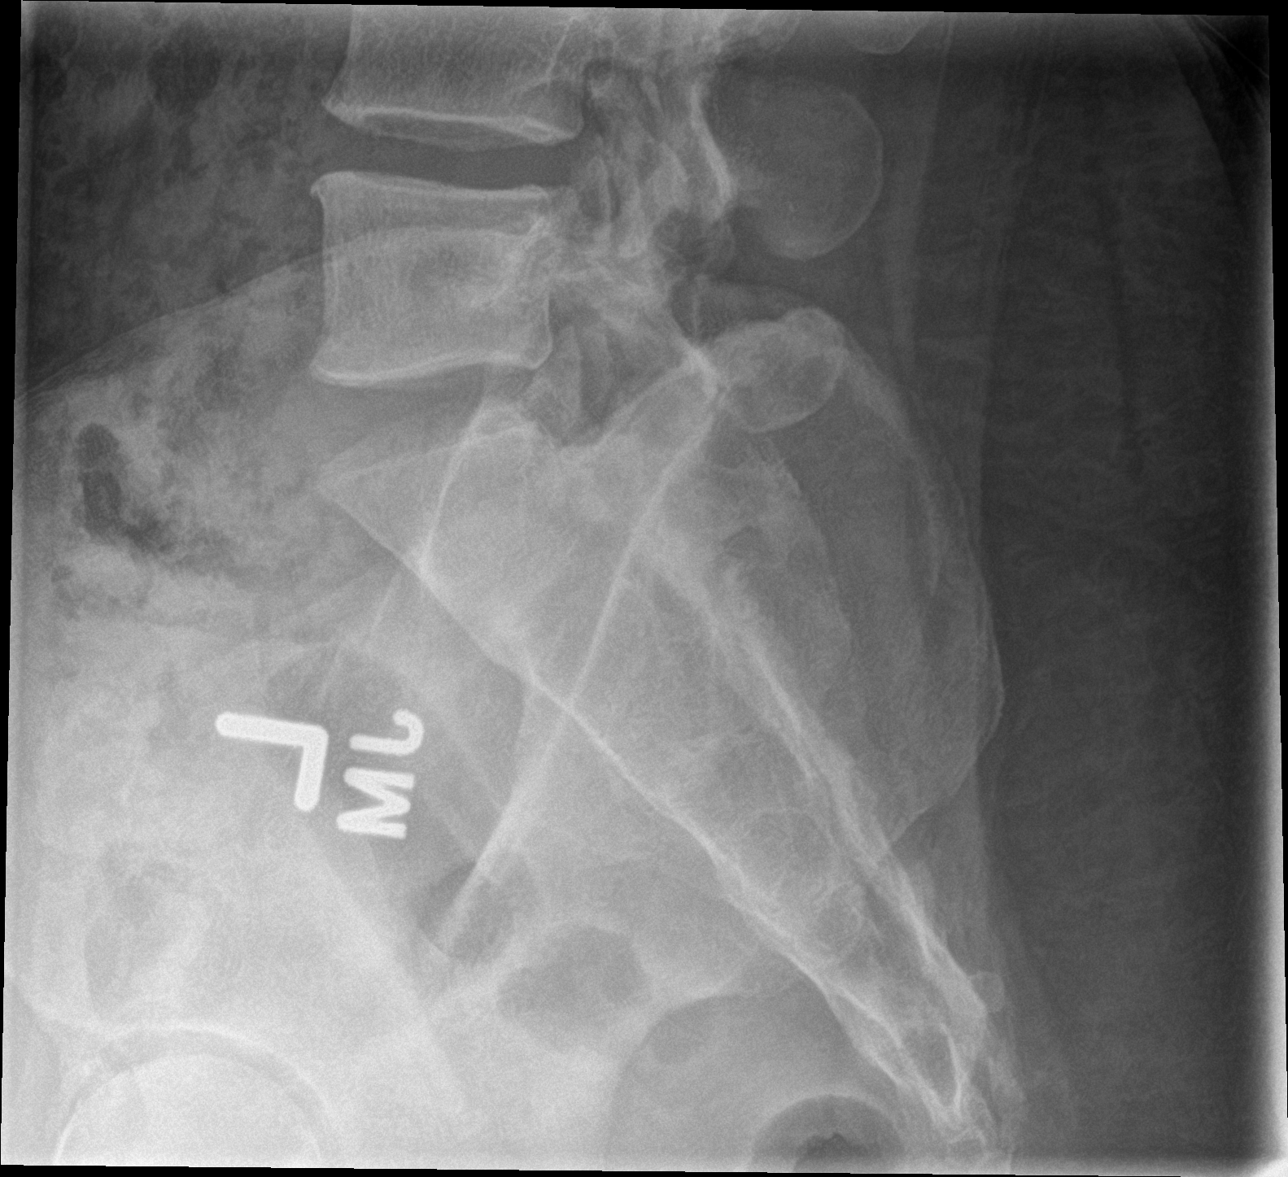

[5 of 5 positions shown; findings below may reference images not displayed]

FINDINGS: No fracture or spondylolisthesis is noted. Disk spaces are
well-maintained. Posterior facet joints appear unremarkable.
IMPRESSION: Normal lumbar spine.

## 2019-01-12 ENCOUNTER — Other Ambulatory Visit: Payer: Self-pay

## 2019-01-12 ENCOUNTER — Ambulatory Visit (INDEPENDENT_AMBULATORY_CARE_PROVIDER_SITE_OTHER): Payer: Managed Care, Other (non HMO) | Admitting: Family Medicine

## 2019-01-12 ENCOUNTER — Encounter: Payer: Self-pay | Admitting: Family Medicine

## 2019-01-12 DIAGNOSIS — M25552 Pain in left hip: Secondary | ICD-10-CM | POA: Diagnosis not present

## 2019-01-12 MED ORDER — PREDNISONE 5 MG PO TABS: ORAL_TABLET | ORAL | 0 refills | Status: DC

## 2019-01-12 NOTE — Assessment & Plan Note (Signed)
Pain seems more related to bursitis in the greater trochanter.  The pain around this seems to be reactionary.  Mechanically it sounds more of the weakness contributed as she is doing a lot of walking over the weekend. -Prednisone. -Counseled on home exercise therapy and supportive care. -If no improvement will consider injection or physical therapy or imaging

## 2019-01-12 NOTE — Progress Notes (Signed)
Shirley Holder - 48 y.o. female MRN [id number]  Date of birth: 1970/08/17  SUBJECTIVE:  Including CC & ROS.  Chief Complaint  Patient presents with  . Back Pain    left-sided low back x 3 days    Shirley Holder is a 48 y.o. female that is presenting with left lateral hip pain.  The pain started over the weekend.  It was severe in nature.  She was at [employer] and was doing a lot of walking that she normally does not do.  The pain is relieved when she is sitting down with her legs out straight.  It is worse anytime that she is ambulating.  The pain seems to be occurring in the left lower lateral back as well as lateral aspect of the hip.  Does have some radiation down the lateral aspect of the leg.  Pain is severe in nature.  It is sharp and throbbing.  It is intermittent in nature.  No specific injury or inciting event.  No history of surgery.  Independent review of the MRI lumbar spine from 7/22 shows slightly displaced L4 nerve root.   Review of Systems  Constitutional: Negative for fever.  HENT: Negative for congestion.   Respiratory: Negative for cough.   Cardiovascular: Negative for chest pain.  Gastrointestinal: Negative for abdominal pain.  Musculoskeletal: Positive for back pain.  Skin: Negative for color change.  Neurological: Negative for weakness.  Hematological: Negative for adenopathy.    HISTORY: Past Medical, Surgical, Social, and Family History Reviewed & Updated per EMR.   Pertinent Historical Findings include:  Past Medical History:  Diagnosis Date  . Cancer [facility])    Breast Cancer  . Hepatitis C   . Urinary tract infection     Past Surgical History:  Procedure Laterality Date  . BREAST BIOPSY  2011  . CHOLECYSTECTOMY  1991  . MASTECTOMY Left 2011  . NECK SURGERY  2007   Neck Fusion; Ruptured Disk  . NOVASURE ABLATION  11/2014  . TONSILLECTOMY  1988  . TUBAL LIGATION      No Known Allergies  Family History  Problem Relation Age of Onset  . Heart  disease Mother   . Heart disease Father   . Heart attack Father   . Diabetes Neg Hx      Social History   Socioeconomic History  . Marital status: Married    Spouse name: Not on file  . Number of children: Not on file  . Years of education: Not on file  . Highest education level: Not on file  Occupational History  . Not on file  Social Needs  . Financial resource strain: Not on file  . Food insecurity    Worry: Not on file    Inability: Not on file  . Transportation needs    Medical: Not on file    Non-medical: Not on file  Tobacco Use  . Smoking status: Never Smoker  . Smokeless tobacco: Never Used  Substance and Sexual Activity  . Alcohol use: Yes    Alcohol/week: 2.0 standard drinks    Types: 2 Glasses of wine per week    Comment: Occasional  . Drug use: No  . Sexual activity: Yes  Lifestyle  . Physical activity    Days per week: Not on file    Minutes per session: Not on file  . Stress: Not on file  Relationships  . Social connections    Talks on phone: Not on file  Gets together: Not on file    Attends religious service: Not on file    Active member of club or organization: Not on file    Attends meetings of clubs or organizations: Not on file    Relationship status: Not on file  . Intimate partner violence    Fear of current or ex partner: Not on file    Emotionally abused: Not on file    Physically abused: Not on file    Forced sexual activity: Not on file  Other Topics Concern  . Not on file  Social History Narrative  . Not on file     PHYSICAL EXAM:  VS: BP 136/90   Ht 5' 2"  (1.575 m)   Wt 160 lb (72.6 kg)   BMI 29.26 kg/m  Physical Exam Gen: NAD, alert, cooperative with exam, well-appearing ENT: normal lips, normal nasal mucosa,  Eye: normal EOM, normal conjunctiva and lids CV:  no edema, +2 pedal pulses   Resp: no accessory muscle use, non-labored,  Skin: no rashes, no areas of induration  Neuro: normal tone, normal sensation to  touch Psych:  normal insight, alert and oriented MSK:  Back/left hip: Tenderness to palpation over the greater trochanter on the left. No significant tenderness palpation over the SI joint or lumbar midline spine. Normal strength resistance with hip flexion, knee flexion and extension, plantarflexion and dorsiflexion. Negative straight leg raise. Negative FADIR and FABER  Weakness with hip abduction and pain  NVI       ASSESSMENT & PLAN:   Greater trochanteric pain syndrome of left lower extremity Pain seems more related to bursitis in the greater trochanter.  The pain around this seems to be reactionary.  Mechanically it sounds more of the weakness contributed as she is doing a lot of walking over the weekend. -Prednisone. -Counseled on home exercise therapy and supportive care. -If no improvement will consider injection or physical therapy or imaging

## 2019-01-12 NOTE — Patient Instructions (Signed)
Nice to meet you Please try ice on the area  Please try the exercises   Please send me a message in [place] with any questions or updates.  Please see me back in 4 weeks or sooner if needed.   --Dr. Raeford Razor

## 2019-01-17 ENCOUNTER — Encounter: Payer: Self-pay | Admitting: Family Medicine

## 2019-01-17 DIAGNOSIS — G4700 Insomnia, unspecified: Secondary | ICD-10-CM

## 2019-01-17 MED ORDER — ZOLPIDEM TARTRATE 10 MG PO TABS: ORAL_TABLET | ORAL | 2 refills | Status: DC

## 2019-04-18 ENCOUNTER — Encounter: Payer: Self-pay | Admitting: Family Medicine

## 2019-04-18 DIAGNOSIS — G4700 Insomnia, unspecified: Secondary | ICD-10-CM

## 2019-04-19 MED ORDER — ZOLPIDEM TARTRATE 10 MG PO TABS: ORAL_TABLET | ORAL | 2 refills | Status: DC

## 2019-05-03 NOTE — Telephone Encounter (Signed)
Received notification that above mychart message has not been ready by pt. Left detailed message on voicemail to call and schedule Virtual medication follow up.

## 2019-05-10 NOTE — Progress Notes (Signed)
[place] at [facility] [address] [place], [place] [address] [phone]  Date:  05/12/2019   Name:  Shirley Holder   DOB:  06-03-1970   MRN:  [id number]  PCP:  Darreld Mclean, MD    Chief Complaint: Medication check   History of Present Illness:  Shirley Holder is a 49 y.o. 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, but has become very busy  History of menorrhagia status post endometrial ablation, breast cancer and left mastectomy 2011, hepatitis C status post curative treatment  Flu vaccine; will do today  Pap is due this year, can do today if she likes; she would prefer to wait until her physical in June Most recent labs done in June-CMP, lipid, CBC, A1c We will do labs at next visit   Her husband yesterday had a possible MI- he is admitted and awaiting cath tomorrow; Dariel is of course stressed  She is taking ambien for sleep- does not use daily but more when she is stressed She has a firm cyst on the dorsum of her left wrist- it has been present for about 3 months Never had in the past  It is not generally painful, can be a little uncomfortable  Patient Active Problem List   Diagnosis Date Noted  . Greater trochanteric pain syndrome of left lower extremity 01/12/2019  . Low back pain radiating to left leg 09/21/2017  . History of hepatitis C 02/01/2016  . Hyperlipidemia 09/21/2015  . 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 Osteopenia 09/21/2015  . Vitamin D deficiency 09/21/2015  . Hyperthyroidism 09/21/2015  . Insomnia 08/14/2015  . Anxiety state 08/14/2015    Past Medical History:  Diagnosis Date  .  Cancer [facility])    Breast Cancer  . Hepatitis C   . Urinary tract infection     Past Surgical History:  Procedure Laterality Date  . BREAST BIOPSY  2011  . CHOLECYSTECTOMY  1991  . MASTECTOMY Left 2011  . NECK SURGERY  2007   Neck Fusion; Ruptured Disk  . NOVASURE ABLATION  11/2014  . TONSILLECTOMY  1988  . TUBAL LIGATION      Social History   Tobacco Use  . Smoking status: Never Smoker  . Smokeless tobacco: Never Used  Substance Use Topics  . Alcohol use: Yes    Alcohol/week: 2.0 standard drinks    Types: 2 Glasses of wine per week    Comment: Occasional  . Drug use: No    Family History  Problem Relation Age of Onset  . Heart disease Mother   . Heart disease Father   . Heart attack Father   . Diabetes Neg Hx     No Known Allergies  Medication list has been reviewed and updated.  Current Outpatient Medications on File Prior to Visit  Medication Sig Dispense Refill  . diclofenac (VOLTAREN) 75 MG EC tablet Take 1 tablet (75 mg total) by mouth 2 (two) times daily. 60 tablet 1  . zolpidem (AMBIEN) 10 MG tablet Take 5 mg by mouth at bedtime as needed for insomnia. 15 tablet 2   No  current facility-administered medications on file prior to visit.    Review of Systems:  As per HPI- otherwise negative.   Physical Examination: Vitals:   05/12/19 1623 05/12/19 1633  BP: (!) 144/84 135/85  Pulse: 75   Resp: 16   Temp: 97.8 F (36.6 C)   SpO2: 98%    Vitals:   05/12/19 1623  Weight: 162 lb (73.5 kg)  Height: 5' 2"  (1.575 m)   Body mass index is 29.63 kg/m. Ideal Body Weight: Weight in (lb) to have BMI = 25: 136.4  GEN: WDWN, NAD, Non-toxic, A & O x 3, mild overweight, looks well HEENT: Atraumatic, Normocephalic. Neck supple. No masses, No LAD. Ears and Nose: No external deformity. CV: RRR, No M/G/R. No JVD. No thrill. No extra heart sounds. PULM: CTA B, no wheezes, crackles, rhonchi. No retractions. No resp. distress. No accessory muscle use. ABD: S,  NT, ND. EXTR: No c/c/e NEURO Normal gait.  PSYCH: Normally interactive. Conversant. Not depressed or anxious appearing.  Calm demeanor.  Blueberry sized ganglion cyst on the dorsum of the left wrist-the joint is freely mobile, no tenderness or swelling   Assessment and Plan: History of ductal carcinoma in situ (DCIS) of breast  History of hepatitis C  Influenza vaccine administered - Plan: Flu Vaccine QUAD 6+ mos PF IM (Fluarix Quad PF)  Ganglion cyst  Primary insomnia  Following up today-she is due for mammogram, will remind her Flu shot given We discussed her ganglion cyst, for the time being she would like to observe.  Consider addressing with hand surgery once COVID-19 pandemic is resolved  We will plan to do a physical exam with labs and Pap in June  Her insomnia is managed with Ambien, she does not need a refill today but will let me know when this is due Moderate medical decision making used today  This visit occurred during the SARS-CoV-2 public health emergency.  Safety protocols were in place, including screening questions prior to the visit, additional usage of staff PPE, and extensive cleaning of exam room while observing appropriate contact time as indicated for disinfecting solutions.    Signed Lamar Blinks, MD

## 2019-05-12 ENCOUNTER — Encounter: Payer: Self-pay | Admitting: Family Medicine

## 2019-05-12 ENCOUNTER — Ambulatory Visit (INDEPENDENT_AMBULATORY_CARE_PROVIDER_SITE_OTHER): Payer: 59 | Admitting: Family Medicine

## 2019-05-12 ENCOUNTER — Other Ambulatory Visit: Payer: Self-pay

## 2019-05-12 VITALS — BP 135/85 | HR 75 | Temp 97.8°F | Resp 16 | Ht 62.0 in | Wt 162.0 lb

## 2019-05-12 DIAGNOSIS — Z23 Encounter for immunization: Secondary | ICD-10-CM | POA: Diagnosis not present

## 2019-05-12 DIAGNOSIS — Z86000 Personal history of in-situ neoplasm of breast: Secondary | ICD-10-CM

## 2019-05-12 DIAGNOSIS — F5101 Primary insomnia: Secondary | ICD-10-CM

## 2019-05-12 DIAGNOSIS — Z8619 Personal history of other infectious and parasitic diseases: Secondary | ICD-10-CM

## 2019-05-12 DIAGNOSIS — M6740 Ganglion, unspecified site: Secondary | ICD-10-CM

## 2019-05-12 NOTE — Patient Instructions (Signed)
It was good to see you today- let me know when you need more ambien Please update me about how your husband is doing- I hope he is home soon!   You got your flu shot today  Be safe, let's visit for a physical in June

## 2019-08-19 ENCOUNTER — Encounter: Payer: Self-pay | Admitting: Family Medicine

## 2019-08-19 DIAGNOSIS — G4700 Insomnia, unspecified: Secondary | ICD-10-CM

## 2019-08-19 MED ORDER — ZOLPIDEM TARTRATE 10 MG PO TABS: ORAL_TABLET | ORAL | 2 refills | Status: DC

## 2019-10-26 NOTE — Progress Notes (Addendum)
[place] at [facility] [place], [place], [place] [address] [phone]  Date:  10/28/2019   Name:  Shirley Holder   DOB:  November 14, 1970   MRN:  [id number]  PCP:  Darreld Mclean, MD    Chief Complaint: Annual Exam (pap)   History of Present Illness:  Shirley Holder is a 49 y.o. 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  No family history- she would like to do cologuard  Most recent labs about 1 year ago  10/20/2019  1   08/19/2019  Zolpidem Tartrate 10 MG Tablet  15.00  30 Je Cop   [id number]   [facility] ([id number])   2  0.25 LME  Comm Ins   [place]  09/23/2019  1   08/19/2019  Zolpidem Tartrate 10 MG Tablet  15.00  30 Je Cop   [id number]   Wal ([id number])   1  0.25 LME  Comm Ins   [place]  09/12/2019  1   09/09/2019  Phentermine 37.5 MG Tablet  30.00  30 Vi Gan   [id number]   [facility] ([id number])   0   Comm Ins   [place]  08/19/2019  1   08/19/2019  Zolpidem Tartrate 10 MG Tablet  15.00  30 Je Cop   [id number]   Wal ([id number])   0  0.25 LME  Comm Ins   [place]  07/19/2019  1   04/19/2019  Zolpidem Tartrate 10 MG Tablet  15.00  30 Je Cop   [id number]   [facility] ([id number])   2  0.25 LME  Comm Ins   [place]  05/22/2019  1   04/19/2019  Zolpidem Tartrate 10 MG Tablet  15.00  30 Je Cop   [id number]   Wal ([id number])   1  0.25 LME  Comm Ins   [place]   04/22/2019  1   04/20/2019  Phentermine 37.5 MG Tablet  30.00  30 Vi Gan   [id number]   [facility] ([id number])   0   Comm Ins   [place]  04/19/2019  1   04/19/2019  Zolpidem Tartrate 10 MG Tablet  15.00  30 Je Cop   [id number]   Wal ([id number])   0  0.25 LME  Comm Ins   [place]  03/20/2019  1   01/17/2019  Zolpidem Tartrate 10 MG Tablet  15.00  30 Je Cop   [id number]   Wal ([id number])   2  0.25 LME  Comm Ins     02/28/2019  1   02/17/2019  Phentermine 37.5 MG Tablet  30.00  30 Vi Gan   [id number]   [facility] ([id number])   0        Patient Active Problem List   Diagnosis Date Noted  . Greater trochanteric pain syndrome of left lower extremity 01/12/2019  . Low back pain  radiating to left leg 09/21/2017  . History of hepatitis C 02/01/2016  . Hyperlipidemia 09/21/2015  . 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 Osteopenia 09/21/2015  . Vitamin D deficiency 09/21/2015  . Hyperthyroidism 09/21/2015  . Insomnia 08/14/2015  . Anxiety state 08/14/2015    Past Medical History:  Diagnosis Date  . Cancer [facility])    Breast Cancer  . Hepatitis C   . Urinary tract infection     Past Surgical History:  Procedure Laterality Date  . BREAST BIOPSY  2011  . CHOLECYSTECTOMY  1991  . MASTECTOMY Left 2011  . NECK SURGERY  2007   Neck Fusion; Ruptured Disk  . NOVASURE ABLATION  11/2014  . TONSILLECTOMY  1988  . TUBAL LIGATION      Social History   Tobacco Use  . Smoking status: Never Smoker  . Smokeless tobacco: Never Used  Substance Use Topics  . Alcohol use: Yes    Alcohol/week: 2.0 standard drinks    Types: 2 Glasses of wine per week    Comment: Occasional  . Drug use: No    Family History  Problem Relation Age of Onset  . Heart disease Mother   . Heart disease Father   . Heart attack Father   . Diabetes Neg Hx     No Known Allergies  Medication list has been reviewed and updated.  Current Outpatient Medications on File Prior to Visit  Medication Sig Dispense Refill  . zolpidem (AMBIEN) 10 MG  tablet Take 5 mg by mouth at bedtime as needed for insomnia. 15 tablet 2   No current facility-administered medications on file prior to visit.    Review of Systems:  As per HPI- otherwise negative.   Physical Examination: Vitals:   10/28/19 1248  BP: 140/80  Pulse: 78  Resp: 16  Temp: 98.1 F (36.7 C)  SpO2: 98%   Vitals:   10/28/19 1248  Weight: 163 lb (73.9 kg)  Height: 5' 2"  (1.575 m)   Body mass index is 29.81 kg/m. Ideal Body Weight: Weight in (lb) to have BMI = 25: 136.4  GEN: no acute distress.  Overweight, looks well and her usual self  HEENT: Atraumatic, Normocephalic.   Bilateral TM wnl, oropharynx normal.  PEERL,EOMI.   Ears and Nose: No external deformity. CV: RRR, No M/G/R. No JVD. No thrill. No extra heart sounds. PULM: CTA B, no wheezes, crackles, rhonchi. No retractions. No resp. distress. No accessory muscle use. ABD: S, NT, ND, +BS. No rebound. No HSM. EXTR: No c/c/e PSYCH: Normally interactive. Conversant.  S/p left mastectomy and right breast reconstruction    Assessment and Plan: Physical exam  Insomnia, unspecified type - Plan: zolpidem (AMBIEN) 10 MG tablet  History of ductal carcinoma in situ (DCIS) of breast  History of hepatitis C  Screening for diabetes mellitus - Plan: Comprehensive metabolic panel, Hemoglobin A1c  Screening for hyperlipidemia - Plan: Lipid panel  Screening for deficiency anemia - Plan: CBC  Screening for cervical cancer - Plan: Cytology - PAP  Hyperthyroidism - Plan: TSH  Screening for colon cancer  Vitamin D deficiency - Plan: VITAMIN D 25 Hydroxy (Vit-D Deficiency, Fractures)  Physical exam today Order cologuard and routine labs Pt uses ambien as needed for sleep- she tries not to use every day She will schedule her mammo Pap today Will plan further follow- up pending labs.  This visit occurred during the SARS-CoV-2 public health emergency.  Safety protocols were in place,  including screening  questions prior to the visit, additional usage of staff PPE, and extensive cleaning of exam room while observing appropriate contact time as indicated for disinfecting solutions.  Does not smoke or drink to excess, encouraged regular exercise and healthy diet   Signed Lamar Blinks, MD  Received her labs as follows-  Results for orders placed or performed in visit on 10/28/19  CBC  Result Value Ref Range   WBC 5.4 4.0 - 10.5 K/uL   RBC 4.93 3.87 - 5.11 Mil/uL   Platelets 180.0 150 - 400 K/uL   Hemoglobin 14.5 12.0 - 15.0 g/dL   HCT 43.8 36 - 46 %   MCV 88.8 78.0 - 100.0 fl   MCHC 33.1 30.0 - 36.0 g/dL   RDW 13.4 11.5 - 15.5 %  Comprehensive metabolic panel  Result Value Ref Range   Sodium 139 135 - 145 mEq/L   Potassium 4.2 3.5 - 5.1 mEq/L   Chloride 103 96 - 112 mEq/L   CO2 31 19 - 32 mEq/L   Glucose, Bld 86 70 - 99 mg/dL   BUN 11 6 - 23 mg/dL   Creatinine, Ser 0.71 0.40 - 1.20 mg/dL   Total Bilirubin 0.8 0.2 - 1.2 mg/dL   Alkaline Phosphatase 41 39 - 117 U/L   AST 15 0 - 37 U/L   ALT 13 0 - 35 U/L   Total Protein 7.1 6.0 - 8.3 g/dL   Albumin 4.4 3.5 - 5.2 g/dL   GFR 87.20 >60.00 mL/min   Calcium 9.4 8.4 - 10.5 mg/dL  Hemoglobin A1c  Result Value Ref Range   Hgb A1c MFr Bld 4.8 4.6 - 6.5 %  Lipid panel  Result Value Ref Range   Cholesterol 194 0 - 200 mg/dL   Triglycerides 60.0 0 - 149 mg/dL   HDL 66.20 >39.00 mg/dL   VLDL 12.0 0.0 - 40.0 mg/dL   LDL Cholesterol 116 (H) 0 - 99 mg/dL   Total CHOL/HDL Ratio 3    NonHDL 127.72   TSH  Result Value Ref Range   TSH 1.28 0.35 - 4.50 uIU/mL  VITAMIN D 25 Hydroxy (Vit-D Deficiency, Fractures)  Result Value Ref Range   VITD 46.89 30.00 - 100.00 ng/mL   Message to pt.

## 2019-10-26 NOTE — Patient Instructions (Addendum)
It was great to see you again today, I will be in touch with your labs soon as possible Please schedule your mammogram You should receive a Cologuard test kit at your home   Health Maintenance, Female Adopting a healthy lifestyle and getting preventive care are important in promoting health and wellness. Ask your health care provider about:  The right schedule for you to have regular tests and exams.  Things you can do on your own to prevent diseases and keep yourself healthy. What should I know about diet, weight, and exercise? Eat a healthy diet   Eat a diet that includes plenty of vegetables, fruits, low-fat dairy products, and lean protein.  Do not eat a lot of foods that are high in solid fats, added sugars, or sodium. Maintain a healthy weight Body mass index (BMI) is used to identify weight problems. It estimates body fat based on height and weight. Your health care provider can help determine your BMI and help you achieve or maintain a healthy weight. Get regular exercise Get regular exercise. This is one of the most important things you can do for your health. Most adults should:  Exercise for at least 150 minutes each week. The exercise should increase your heart rate and make you sweat (moderate-intensity exercise).  Do strengthening exercises at least twice a week. This is in addition to the moderate-intensity exercise.  Spend less time sitting. Even light physical activity can be beneficial. Watch cholesterol and blood lipids Have your blood tested for lipids and cholesterol at 50 years of age, then have this test every 5 years. Have your cholesterol levels checked more often if:  Your lipid or cholesterol levels are high.  You are older than 50 years of age.  You are at high risk for heart disease. What should I know about cancer screening? Depending on your health history and family history, you may need to have cancer screening at various ages. This may include  screening for:  Breast cancer.  Cervical cancer.  Colorectal cancer.  Skin cancer.  Lung cancer. What should I know about heart disease, diabetes, and high blood pressure? Blood pressure and heart disease  High blood pressure causes heart disease and increases the risk of stroke. This is more likely to develop in people who have high blood pressure readings, are of African descent, or are overweight.  Have your blood pressure checked: ? Every 3-5 years if you are 15-33 years of age. ? Every year if you are 51 years old or older. Diabetes Have regular diabetes screenings. This checks your fasting blood sugar level. Have the screening done:  Once every three years after age 60 if you are at a normal weight and have a low risk for diabetes.  More often and at a younger age if you are overweight or have a high risk for diabetes. What should I know about preventing infection? Hepatitis B If you have a higher risk for hepatitis B, you should be screened for this virus. Talk with your health care provider to find out if you are at risk for hepatitis B infection. Hepatitis C Testing is recommended for:  Everyone born from [birth year] through 1966.  Anyone with known risk factors for hepatitis C. Sexually transmitted infections (STIs)  Get screened for STIs, including gonorrhea and chlamydia, if: ? You are sexually active and are younger than 50 years of age. ? You are older than 50 years of age and your health care provider tells you that you  are at risk for this type of infection. ? Your sexual activity has changed since you were last screened, and you are at increased risk for chlamydia or gonorrhea. Ask your health care provider if you are at risk.  Ask your health care provider about whether you are at high risk for HIV. Your health care provider may recommend a prescription medicine to help prevent HIV infection. If you choose to take medicine to prevent HIV, you should first get  tested for HIV. You should then be tested every 3 months for as long as you are taking the medicine. Pregnancy  If you are about to stop having your period (premenopausal) and you may become pregnant, seek counseling before you get pregnant.  Take 400 to 800 micrograms (mcg) of folic acid every day if you become pregnant.  Ask for birth control (contraception) if you want to prevent pregnancy. Osteoporosis and menopause Osteoporosis is a disease in which the bones lose minerals and strength with aging. This can result in bone fractures. If you are 25 years old or older, or if you are at risk for osteoporosis and fractures, ask your health care provider if you should:  Be screened for bone loss.  Take a calcium or vitamin D supplement to lower your risk of fractures.  Be given hormone replacement therapy (HRT) to treat symptoms of menopause. Follow these instructions at home: Lifestyle  Do not use any products that contain nicotine or tobacco, such as cigarettes, e-cigarettes, and chewing tobacco. If you need help quitting, ask your health care provider.  Do not use street drugs.  Do not share needles.  Ask your health care provider for help if you need support or information about quitting drugs. Alcohol use  Do not drink alcohol if: ? Your health care provider tells you not to drink. ? You are pregnant, may be pregnant, or are planning to become pregnant.  If you drink alcohol: ? Limit how much you use to 0-1 drink a day. ? Limit intake if you are breastfeeding.  Be aware of how much alcohol is in your drink. In the [place] one drink equals one 12 oz bottle of beer (355 mL), one 5 oz glass of wine (148 mL), or one 1 oz glass of hard liquor (44 mL). General instructions  Schedule regular health, dental, and eye exams.  Stay current with your vaccines.  Tell your health care provider if: ? You often feel depressed. ? You have ever been abused or do not feel safe at  home. Summary  Adopting a healthy lifestyle and getting preventive care are important in promoting health and wellness.  Follow your health care provider's instructions about healthy diet, exercising, and getting tested or screened for diseases.  Follow your health care provider's instructions on monitoring your cholesterol and blood pressure. This information is not intended to replace advice given to you by your health care provider. Make sure you discuss any questions you have with your health care provider. Document Revised: 04/01/2018 Document Reviewed: 04/01/2018 Elsevier Patient Education  2020 [employer].

## 2019-10-28 ENCOUNTER — Other Ambulatory Visit (HOSPITAL_COMMUNITY)
Admission: RE | Admit: 2019-10-28 | Discharge: 2019-10-28 | Disposition: A | Discharge status: Home / Self Care | Payer: 59 | Source: Ambulatory Visit | Attending: Family Medicine | Admitting: Family Medicine

## 2019-10-28 ENCOUNTER — Encounter: Payer: Self-pay | Admitting: Family Medicine

## 2019-10-28 ENCOUNTER — Other Ambulatory Visit: Payer: Self-pay

## 2019-10-28 ENCOUNTER — Ambulatory Visit (INDEPENDENT_AMBULATORY_CARE_PROVIDER_SITE_OTHER): Payer: 59 | Admitting: Family Medicine

## 2019-10-28 VITALS — BP 140/80 | HR 78 | Temp 98.1°F | Resp 16 | Ht 62.0 in | Wt 163.0 lb

## 2019-10-28 DIAGNOSIS — Z130 Encounter for screening for diseases of the blood and blood-forming organs and certain disorders involving the immune mechanism: Secondary | ICD-10-CM

## 2019-10-28 DIAGNOSIS — Z8619 Personal history of other infectious and parasitic diseases: Secondary | ICD-10-CM | POA: Diagnosis not present

## 2019-10-28 DIAGNOSIS — Z13220 Encounter for screening for lipoid disorders: Secondary | ICD-10-CM | POA: Diagnosis not present

## 2019-10-28 DIAGNOSIS — E0590 Thyrotoxicosis, unspecified without thyrotoxic crisis or storm: Secondary | ICD-10-CM | POA: Diagnosis not present

## 2019-10-28 DIAGNOSIS — Z131 Encounter for screening for diabetes mellitus: Secondary | ICD-10-CM | POA: Diagnosis not present

## 2019-10-28 DIAGNOSIS — G4700 Insomnia, unspecified: Secondary | ICD-10-CM | POA: Diagnosis not present

## 2019-10-28 DIAGNOSIS — Z124 Encounter for screening for malignant neoplasm of cervix: Secondary | ICD-10-CM | POA: Diagnosis not present

## 2019-10-28 DIAGNOSIS — Z0000 Encounter for general adult medical examination without abnormal findings: Secondary | ICD-10-CM

## 2019-10-28 DIAGNOSIS — Z86000 Personal history of in-situ neoplasm of breast: Secondary | ICD-10-CM

## 2019-10-28 DIAGNOSIS — E559 Vitamin D deficiency, unspecified: Secondary | ICD-10-CM | POA: Diagnosis not present

## 2019-10-28 DIAGNOSIS — Z1211 Encounter for screening for malignant neoplasm of colon: Secondary | ICD-10-CM

## 2019-10-28 LAB — LIPID PANEL
Cholesterol: 194 mg/dL (ref 0–200)
HDL: 66.2 mg/dL (ref >39.00)
LDL Cholesterol: 116 mg/dL — ABNORMAL HIGH (ref 0–99)
NonHDL: 127.72
Total CHOL/HDL Ratio: 3
Triglycerides: 60 mg/dL (ref 0.0–149.0)
VLDL: 12 mg/dL (ref 0.0–40.0)

## 2019-10-28 LAB — COMPREHENSIVE METABOLIC PANEL
ALT: 13 U/L (ref 0–35)
AST: 15 U/L (ref 0–37)
Albumin: 4.4 g/dL (ref 3.5–5.2)
Alkaline Phosphatase: 41 U/L (ref 39–117)
BUN: 11 mg/dL (ref 6–23)
CO2: 31 mEq/L (ref 19–32)
Calcium: 9.4 mg/dL (ref 8.4–10.5)
Chloride: 103 mEq/L (ref 96–112)
Creatinine, Ser: 0.71 mg/dL (ref 0.40–1.20)
GFR: 87.2 mL/min (ref >60.00)
Glucose, Bld: 86 mg/dL (ref 70–99)
Potassium: 4.2 mEq/L (ref 3.5–5.1)
Sodium: 139 mEq/L (ref 135–145)
Total Bilirubin: 0.8 mg/dL (ref 0.2–1.2)
Total Protein: 7.1 g/dL (ref 6.0–8.3)

## 2019-10-28 LAB — HEMOGLOBIN A1C: Hgb A1c MFr Bld: 4.8 % (ref 4.6–6.5)

## 2019-10-28 LAB — CBC
HCT: 43.8 % (ref 36.0–46.0)
Hemoglobin: 14.5 g/dL (ref 12.0–15.0)
MCHC: 33.1 g/dL (ref 30.0–36.0)
MCV: 88.8 fl (ref 78.0–100.0)
Platelets: 180 10*3/uL (ref 150.0–400.0)
RBC: 4.93 Mil/uL (ref 3.87–5.11)
RDW: 13.4 % (ref 11.5–15.5)
WBC: 5.4 10*3/uL (ref 4.0–10.5)

## 2019-10-28 LAB — VITAMIN D 25 HYDROXY (VIT D DEFICIENCY, FRACTURES): VITD: 46.89 ng/mL (ref 30.00–100.00)

## 2019-10-28 LAB — TSH: TSH: 1.28 u[IU]/mL (ref 0.35–4.50)

## 2019-10-28 MED ORDER — ZOLPIDEM TARTRATE 10 MG PO TABS: ORAL_TABLET | ORAL | 2 refills | Status: DC

## 2019-10-28 NOTE — Progress Notes (Signed)
Cologuard ordered for patient.

## 2019-10-29 ENCOUNTER — Encounter: Payer: Self-pay | Admitting: Family Medicine

## 2019-10-29 LAB — CYTOLOGY - PAP
Comment: NEGATIVE
Diagnosis: NEGATIVE
High risk HPV: NEGATIVE

## 2019-11-08 LAB — COLOGUARD: Cologuard: NEGATIVE

## 2019-11-12 LAB — COLOGUARD: COLOGUARD: NEGATIVE

## 2019-11-15 ENCOUNTER — Encounter: Payer: Self-pay | Admitting: Family Medicine

## 2020-02-17 ENCOUNTER — Encounter: Payer: Self-pay | Admitting: Family Medicine

## 2020-02-17 DIAGNOSIS — G4700 Insomnia, unspecified: Secondary | ICD-10-CM

## 2020-02-17 MED ORDER — ZOLPIDEM TARTRATE 10 MG PO TABS: ORAL_TABLET | ORAL | 2 refills | Status: DC

## 2020-05-09 ENCOUNTER — Encounter: Payer: Self-pay | Admitting: Family Medicine

## 2020-05-09 NOTE — Progress Notes (Unsigned)
[place] at [facility] [address], [place], [place] [address] [phone]  Date:  05/11/2020   Name:  Shirley Holder   DOB:  Feb 09, 1971   MRN:  [id number]  PCP:  Darreld Mclean, MD    Chief Complaint: No chief complaint on file.   History of Present Illness:  Shirley Holder is a 50 y.o. very pleasant female patient who presents with the following:  Patient here today to discuss medications- History of osteopenia, hyperlipidemia, breast cancer with left mastectomy 2011, vitamin D deficiency, hypothyroidism, hepatitis C status post curative treatment, insomnia uses Ambien Last seen by myself in July for a physical  Virtual visit preferred by patient today due to COVID-19 pandemic and weather Patient location is home, provider location is office Patient identity confirmed with 2 factors, she gives consent for virtual visit today The pt and myself are present on the call today   Mammogram- overdue, encouraged her to schedule this  COVID-19 booster- done  Flu vaccine- not done yet but she plans to do so  Pap is up-to-date Recently completed Cologuard  She uses Ambien on a regular basis for insomnia  Her husband tested positive for covid over the weekend  She herself has not had any symptoms Her mother also lives with them and she has been ok   She uses ambien 5 mg as needed for sleep She tries not to use it every night but has been under more stress recently so she has used more  She has been trying to manage her anxiety- she does not feel like she needs anything esp for anxiety right now  I reminded her to get her mammo as soon as she feels comfortable  04/20/2020  02/17/2020   1  Zolpidem Tartrate 10 Mg Tablet  15.00  30  Je Cop  [id number]  Wal ([id number])  2/2  0.25 LME  Comm Ins  [place]    03/20/2020  02/17/2020   1  Zolpidem Tartrate 10 Mg Tablet  15.00  30  Je Cop  [id number]  Wal ([id number])  1/2  0.25 LME  Comm Ins  [place]    02/17/2020   02/17/2020   1  Zolpidem Tartrate 10 Mg Tablet  15.00  30  Je Cop  [id number]  Wal ([id number])  0/2  0.25 LME  Comm Ins  [place]    02/11/2020  02/10/2020   1  Phentermine 37.5 Mg Tablet  30.00  30  Vi Gan  [id number]  Har ([id number])  0/0   Comm Ins  [place]    01/21/2020  10/28/2019   1  Zolpidem Tartrate 10 Mg Tablet  15.00  30  Je Cop  [id number]  Wal ([id number])  2/2  0.25 LME  Comm Ins  [place]    12/28/2019  12/21/2019   1  Phentermine 37.5 Mg Capsule  30.00  30  Vi Gan  [id number]  Har ([id number])  0/0   Comm Ins  [place]    12/21/2019  10/28/2019   1  Zolpidem Tartrate 10 Mg Tablet  15.00  30  Je Cop            Patient Active Problem List   Diagnosis Date Noted  . Greater trochanteric pain syndrome of left lower extremity 01/12/2019  . Low back pain radiating to left leg 09/21/2017  . History of hepatitis C 02/01/2016  . Hyperlipidemia 09/21/2015  . History of ductal carcinoma in situ (DCIS)  of breast 09/21/2015  . Osteopenia 09/21/2015  . Vitamin D deficiency 09/21/2015  . Hyperthyroidism 09/21/2015  . Insomnia 08/14/2015  . Anxiety state 08/14/2015    Past Medical History:  Diagnosis Date  . Cancer [facility])    Breast Cancer  . Hepatitis C   . Urinary tract infection     Past Surgical History:  Procedure Laterality Date  . BREAST BIOPSY  2011  . CHOLECYSTECTOMY  1991  . MASTECTOMY Left 2011  . NECK SURGERY  2007   Neck Fusion; Ruptured Disk  . NOVASURE ABLATION  11/2014  . TONSILLECTOMY  1988  . TUBAL LIGATION      Social History   Tobacco Use  . Smoking status: Never Smoker  . Smokeless tobacco: Never Used  Substance Use Topics  . Alcohol use: Yes    Alcohol/week: 2.0 standard drinks    Types: 2 Glasses of wine per week    Comment: Occasional  . Drug use: No    Family History  Problem Relation Age of Onset  . Heart disease Mother   . Heart disease Father   . Heart attack Father   . Diabetes Neg Hx     No Known Allergies  Medication list has been reviewed and updated.  Current Outpatient  Medications on File Prior to Visit  Medication Sig Dispense Refill  . zolpidem (AMBIEN) 10 MG tablet Take 5 mg by mouth at bedtime as needed for insomnia. 15 tablet 2   No current facility-administered medications on file prior to visit.    Review of Systems:  As per HPI- otherwise negative.   Physical Examination: There were no vitals filed for this visit. There were no vitals filed for this visit. There is no height or weight on file to calculate BMI. Ideal Body Weight:    Pt observed via video monitor- she looks well Checking her temps only She is not checking her BP   Assessment and Plan: Insomnia, unspecified type  Following up today on controlled medication- ambien.  Pt is using a safe dosage as directed Refilled for her today Plan to see me for a CPE in 6 months  Video used for duration of visit today Meds ordered this encounter  Medications  . zolpidem (AMBIEN) 10 MG tablet    Sig: Take 5 mg by mouth at bedtime as needed for insomnia.    Dispense:  15 tablet    Refill:  2    This visit occurred during the SARS-CoV-2 public health emergency.  Safety protocols were in place, including screening questions prior to the visit, additional usage of staff PPE, and extensive cleaning of exam room while observing appropriate contact time as indicated for disinfecting solutions.    Signed Lamar Blinks, MD

## 2020-05-09 NOTE — Patient Instructions (Signed)
It was great to see you again today!

## 2020-05-11 ENCOUNTER — Other Ambulatory Visit: Payer: Self-pay

## 2020-05-11 ENCOUNTER — Encounter: Payer: Self-pay | Admitting: Family Medicine

## 2020-05-11 ENCOUNTER — Ambulatory Visit (INDEPENDENT_AMBULATORY_CARE_PROVIDER_SITE_OTHER): Payer: 59 | Admitting: Family Medicine

## 2020-05-11 DIAGNOSIS — G4700 Insomnia, unspecified: Secondary | ICD-10-CM | POA: Diagnosis not present

## 2020-05-11 MED ORDER — ZOLPIDEM TARTRATE 10 MG PO TABS: ORAL_TABLET | ORAL | 2 refills | Status: DC

## 2020-08-16 ENCOUNTER — Encounter: Payer: Self-pay | Admitting: Family Medicine

## 2020-08-16 DIAGNOSIS — G4700 Insomnia, unspecified: Secondary | ICD-10-CM

## 2020-08-17 MED ORDER — ZOLPIDEM TARTRATE 10 MG PO TABS: ORAL_TABLET | ORAL | 2 refills | Status: DC

## 2020-08-21 NOTE — Progress Notes (Addendum)
[place] at [facility] [address] [place], [place] [address] [phone]  Date:  08/23/2020   Name:  Shirley Holder   DOB:  October 27, 1970   MRN:  [id number]  PCP:  Darreld Mclean, MD    Chief Complaint: Anxiety (F/u ) and Allergic Rhinitis  (Going on the last week and a half. )   History of Present Illness:  Shirley Holder is a 50 y.o. very pleasant female patient who presents with the following:  Patient here today with concern of anxiety.  Last seen by myself in January History of osteopenia, hyperlipidemia, breast cancer with left mastectomy 2011, vitamin D deficiency, hypothyroidism, hepatitis C status post curative treatment, insomnia uses Ambien  In January, Shirley Holder was using her Ambien 5 mg more frequently due to increased stress  Mammogram- overdue, ordered right-sided screening mammogram COVID-19 booster done, fourth dose?  Can update labs today if she would like, most recent labs July 2021  She notes "sinus stuff" for 10 days.  She has used claritin and zyrtec- this does help some but not a lot No fever noted She will feel really congested in the afternoons esp Mild nasal discharge- may be stuffy and then runny No cough No ST No GI symptoms    Also, pt notes that "I feel like I'm going through menopause or something" She feels worried all the time, is easily tearful No SI Her sex drive is low Her marriage is good She has felt depressed/ anxious for about a month now She feels like anxiety is her most prominent feature- she feels "on edge" all the time She is sleeping well Energy level is so so  They did get to go on vacation to [place] last month- she had fun but had difficulty relaxing   LMP was about 6 months ago - she does get hot flashes at times She has status post tubal ligation Patient Active Problem List   Diagnosis Date Noted  . Greater trochanteric pain syndrome of left lower extremity 01/12/2019   . Low back pain radiating to left leg 09/21/2017  . History of hepatitis C 02/01/2016  . Hyperlipidemia 09/21/2015  . 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 Osteopenia 09/21/2015  . Vitamin D deficiency 09/21/2015  . Hyperthyroidism 09/21/2015  . Insomnia 08/14/2015  . Anxiety state 08/14/2015    Past Medical History:  Diagnosis Date  . Cancer [facility])    Breast Cancer  . Hepatitis C   . Urinary tract infection     Past Surgical History:  Procedure Laterality Date  . BREAST BIOPSY  2011  . CHOLECYSTECTOMY  1991  . MASTECTOMY Left 2011  . NECK SURGERY  2007   Neck Fusion; Ruptured Disk  . NOVASURE ABLATION  11/2014  . TONSILLECTOMY  1988  . TUBAL LIGATION      Social History   Tobacco Use  . Smoking status: Never Smoker  . Smokeless tobacco: Never Used  Substance Use Topics  . Alcohol use: Yes    Alcohol/week: 2.0 standard drinks    Types: 2 Glasses of wine per week    Comment: Occasional  . Drug use: No    Family History  Problem Relation Age of Onset  . Heart disease Mother   . Heart disease Father   . Heart attack Father   . Diabetes Neg Hx     No Known Allergies  Medication list has been reviewed  and updated.  Current Outpatient Medications on File Prior to Visit  Medication Sig Dispense Refill  . zolpidem (AMBIEN) 10 MG tablet Take 5 mg by mouth at bedtime as needed for insomnia. 15 tablet 2   No current facility-administered medications on file prior to visit.    Review of Systems:  As per HPI- otherwise negative.   Physical Examination: Vitals:   08/23/20 0858  BP: (!) 140/96  Pulse: 74  Temp: 98 F (36.7 C)  SpO2: 99%   Vitals:   08/23/20 0858  Weight: 174 lb (78.9 kg)  Height: 5' 2"  (1.575 m)   Body mass index is 31.83 kg/m. Ideal Body Weight: Weight in (lb) to have BMI = 25: 136.4  GEN: no acute distress.  Overweight, looks well HEENT: Atraumatic, Normocephalic.   Bilateral TM wnl, oropharynx  normal.  PEERL,EOMI.   Ears and Nose: No external deformity. CV: RRR, No M/G/R. No JVD. No thrill. No extra heart sounds. PULM: CTA B, no wheezes, crackles, rhonchi. No retractions. No resp. distress. No accessory muscle use. ABD: S, NT, ND, +BS. No rebound. No HSM. EXTR: No c/c/e PSYCH: Normally interactive. Conversant.    Assessment and Plan: GAD (generalized anxiety disorder) - Plan: venlafaxine XR (EFFEXOR XR) 75 MG 24 hr capsule  Screening mammogram for breast cancer - Plan: MM 3D SCREEN BREAST UNI RIGHT  Screening for diabetes mellitus - Plan: Comprehensive metabolic panel, Hemoglobin A1c  Screening for hyperlipidemia - Plan: Lipid panel  Screening for deficiency anemia - Plan: CBC  Fatigue, unspecified type - Plan: TSH, VITAMIN D 25 Hydroxy (Vit-D Deficiency, Fractures)  Amenorrhea - Plan: [place]  Hot flashes - Plan: venlafaxine XR (EFFEXOR XR) 75 MG 24 hr capsule  Here today for a follow-up visit.  Labs are pending as above Suspect menopause, no.  In about 6 months and hot flash symptoms.  We will check an [place] level today She notes symptoms of anxiety and adjustment depression.  We will have her start on venlafaxine XR, this may also help with hot flashes.  I have asked her let me know how this is working for her over the next few weeks, if needed we can increase the dose  Will plan further follow- up pending labs.  This visit occurred during the SARS-CoV-2 public health emergency.  Safety protocols were in place, including screening questions prior to the visit, additional usage of staff PPE, and extensive cleaning of exam room while observing appropriate contact time as indicated for disinfecting solutions.    Signed Shirley Blinks, MD  Received her labs, message to pt  Results for orders placed or performed in visit on 08/23/20  [facility]  Result Value Ref Range   FSH 63.9 mIU/ML  CBC  Result Value Ref Range   WBC 6.2 4.0 - 10.5 K/uL   RBC 4.99 3.87 - 5.11 Mil/uL    Platelets 235.0 150.0 - 400.0 K/uL   Hemoglobin 14.6 12.0 - 15.0 g/dL   HCT 43.4 36.0 - 46.0 %   MCV 87.1 78.0 - 100.0 fl   MCHC 33.6 30.0 - 36.0 g/dL   RDW 13.0 11.5 - 15.5 %  Comprehensive metabolic panel  Result Value Ref Range   Sodium 140 135 - 145 mEq/L   Potassium 4.4 3.5 - 5.1 mEq/L   Chloride 103 96 - 112 mEq/L   CO2 30 19 - 32 mEq/L   Glucose, Bld 83 70 - 99 mg/dL   BUN 20 6 - 23 mg/dL   Creatinine, Ser  0.77 0.40 - 1.20 mg/dL   Total Bilirubin 0.7 0.2 - 1.2 mg/dL   Alkaline Phosphatase 51 39 - 117 U/L   AST 33 0 - 37 U/L   ALT 30 0 - 35 U/L   Total Protein 7.6 6.0 - 8.3 g/dL   Albumin 4.5 3.5 - 5.2 g/dL   GFR 89.81 >60.00 mL/min   Calcium 9.8 8.4 - 10.5 mg/dL  Hemoglobin A1c  Result Value Ref Range   Hgb A1c MFr Bld 5.1 4.6 - 6.5 %  Lipid panel  Result Value Ref Range   Cholesterol 204 (H) 0 - 200 mg/dL   Triglycerides 68.0 0.0 - 149.0 mg/dL   HDL 73.60 >39.00 mg/dL   VLDL 13.6 0.0 - 40.0 mg/dL   LDL Cholesterol 116 (H) 0 - 99 mg/dL   Total CHOL/HDL Ratio 3    NonHDL 130.02   TSH  Result Value Ref Range   TSH 1.82 0.35 - 4.50 uIU/mL  VITAMIN D 25 Hydroxy (Vit-D Deficiency, Fractures)  Result Value Ref Range   VITD 43.55 30.00 - 100.00 ng/mL

## 2020-08-21 NOTE — Patient Instructions (Addendum)
It is always great to see you!    Not done already, please update your mammogram and consider getting a fourth dose of COVID-19 vaccine  For your sinuses- please pick up a nasal steroid spray like flonase or nasacort OTC.  Try this for a week.  If not better please contact me We will be in touch with your labs.  I will do an [place] level to determine if you are in menopause For anxiety and hot flashes, let's have you try Effexor. Please let me know how this is working for you- please send me a message in 2-3 weeks.

## 2020-08-23 ENCOUNTER — Encounter: Payer: Self-pay | Admitting: Family Medicine

## 2020-08-23 ENCOUNTER — Ambulatory Visit (INDEPENDENT_AMBULATORY_CARE_PROVIDER_SITE_OTHER): Payer: 59 | Admitting: Family Medicine

## 2020-08-23 ENCOUNTER — Other Ambulatory Visit: Payer: Self-pay

## 2020-08-23 VITALS — BP 140/96 | HR 74 | Temp 98.0°F | Ht 62.0 in | Wt 174.0 lb

## 2020-08-23 DIAGNOSIS — F411 Generalized anxiety disorder: Secondary | ICD-10-CM

## 2020-08-23 DIAGNOSIS — R5383 Other fatigue: Secondary | ICD-10-CM

## 2020-08-23 DIAGNOSIS — Z131 Encounter for screening for diabetes mellitus: Secondary | ICD-10-CM | POA: Diagnosis not present

## 2020-08-23 DIAGNOSIS — R232 Flushing: Secondary | ICD-10-CM

## 2020-08-23 DIAGNOSIS — Z1231 Encounter for screening mammogram for malignant neoplasm of breast: Secondary | ICD-10-CM | POA: Diagnosis not present

## 2020-08-23 DIAGNOSIS — Z13220 Encounter for screening for lipoid disorders: Secondary | ICD-10-CM | POA: Diagnosis not present

## 2020-08-23 DIAGNOSIS — N912 Amenorrhea, unspecified: Secondary | ICD-10-CM | POA: Diagnosis not present

## 2020-08-23 DIAGNOSIS — Z130 Encounter for screening for diseases of the blood and blood-forming organs and certain disorders involving the immune mechanism: Secondary | ICD-10-CM | POA: Diagnosis not present

## 2020-08-23 LAB — VITAMIN D 25 HYDROXY (VIT D DEFICIENCY, FRACTURES): VITD: 43.55 ng/mL (ref 30.00–100.00)

## 2020-08-23 LAB — COMPREHENSIVE METABOLIC PANEL
ALT: 30 U/L (ref 0–35)
AST: 33 U/L (ref 0–37)
Albumin: 4.5 g/dL (ref 3.5–5.2)
Alkaline Phosphatase: 51 U/L (ref 39–117)
BUN: 20 mg/dL (ref 6–23)
CO2: 30 mEq/L (ref 19–32)
Calcium: 9.8 mg/dL (ref 8.4–10.5)
Chloride: 103 mEq/L (ref 96–112)
Creatinine, Ser: 0.77 mg/dL (ref 0.40–1.20)
GFR: 89.81 mL/min (ref >60.00)
Glucose, Bld: 83 mg/dL (ref 70–99)
Potassium: 4.4 mEq/L (ref 3.5–5.1)
Sodium: 140 mEq/L (ref 135–145)
Total Bilirubin: 0.7 mg/dL (ref 0.2–1.2)
Total Protein: 7.6 g/dL (ref 6.0–8.3)

## 2020-08-23 LAB — CBC
HCT: 43.4 % (ref 36.0–46.0)
Hemoglobin: 14.6 g/dL (ref 12.0–15.0)
MCHC: 33.6 g/dL (ref 30.0–36.0)
MCV: 87.1 fl (ref 78.0–100.0)
Platelets: 235 10*3/uL (ref 150.0–400.0)
RBC: 4.99 Mil/uL (ref 3.87–5.11)
RDW: 13 % (ref 11.5–15.5)
WBC: 6.2 10*3/uL (ref 4.0–10.5)

## 2020-08-23 LAB — LIPID PANEL
Cholesterol: 204 mg/dL — ABNORMAL HIGH (ref 0–200)
HDL: 73.6 mg/dL (ref >39.00)
LDL Cholesterol: 116 mg/dL — ABNORMAL HIGH (ref 0–99)
NonHDL: 130.02
Total CHOL/HDL Ratio: 3
Triglycerides: 68 mg/dL (ref 0.0–149.0)
VLDL: 13.6 mg/dL (ref 0.0–40.0)

## 2020-08-23 LAB — TSH: TSH: 1.82 u[IU]/mL (ref 0.35–4.50)

## 2020-08-23 LAB — HEMOGLOBIN A1C: Hgb A1c MFr Bld: 5.1 % (ref 4.6–6.5)

## 2020-08-23 LAB — FOLLICLE STIMULATING HORMONE: FSH: 63.9 m[IU]/mL

## 2020-08-23 MED ORDER — VENLAFAXINE HCL ER 75 MG PO CP24: 75.0000 mg | ORAL_CAPSULE | Freq: Every day | ORAL | 3 refills | Status: DC

## 2020-08-25 MED ORDER — VENLAFAXINE HCL ER 37.5 MG PO CP24: 37.5000 mg | ORAL_CAPSULE | Freq: Every day | ORAL | 0 refills | Status: DC

## 2020-08-25 NOTE — Addendum Note (Signed)
Addended by: Lamar Blinks C on: 08/25/2020 07:03 AM   Modules accepted: Orders

## 2020-08-29 ENCOUNTER — Encounter (HOSPITAL_BASED_OUTPATIENT_CLINIC_OR_DEPARTMENT_OTHER): Payer: Self-pay

## 2020-08-29 ENCOUNTER — Other Ambulatory Visit: Payer: Self-pay

## 2020-08-29 ENCOUNTER — Ambulatory Visit (HOSPITAL_BASED_OUTPATIENT_CLINIC_OR_DEPARTMENT_OTHER)
Admission: RE | Admit: 2020-08-29 | Discharge: 2020-08-29 | Disposition: A | Discharge status: Home / Self Care | Payer: 59 | Source: Ambulatory Visit | Attending: Family Medicine | Admitting: Family Medicine

## 2020-08-29 DIAGNOSIS — Z1231 Encounter for screening mammogram for malignant neoplasm of breast: Secondary | ICD-10-CM | POA: Diagnosis present

## 2020-09-20 ENCOUNTER — Encounter: Payer: Self-pay | Admitting: Family Medicine

## 2020-09-20 DIAGNOSIS — U071 COVID-19: Secondary | ICD-10-CM

## 2020-09-21 MED ORDER — NIRMATRELVIR/RITONAVIR (PAXLOVID)TABLET: 3.0000 | ORAL_TABLET | Freq: Two times a day (BID) | ORAL | 0 refills | Status: AC

## 2020-09-21 NOTE — Addendum Note (Signed)
Addended by: Lamar Blinks C on: 09/21/2020 05:53 AM   Modules accepted: Orders

## 2020-11-17 ENCOUNTER — Encounter: Payer: Self-pay | Admitting: Family Medicine

## 2020-11-17 DIAGNOSIS — G4700 Insomnia, unspecified: Secondary | ICD-10-CM

## 2020-11-17 MED ORDER — ZOLPIDEM TARTRATE 10 MG PO TABS: ORAL_TABLET | ORAL | 2 refills | Status: DC

## 2020-12-20 ENCOUNTER — Encounter: Payer: Self-pay | Admitting: Family Medicine

## 2020-12-20 DIAGNOSIS — F411 Generalized anxiety disorder: Secondary | ICD-10-CM

## 2020-12-20 DIAGNOSIS — R232 Flushing: Secondary | ICD-10-CM

## 2020-12-20 MED ORDER — VENLAFAXINE HCL ER 75 MG PO CP24
75.0000 mg | ORAL_CAPSULE | Freq: Every day | ORAL | 3 refills | Status: DC
Start: 2020-12-20 — End: 2021-04-27

## 2020-12-20 MED ORDER — VENLAFAXINE HCL ER 37.5 MG PO CP24
37.5000 mg | ORAL_CAPSULE | Freq: Every day | ORAL | 3 refills | Status: DC
Start: 2020-12-20 — End: 2021-04-27

## 2021-02-14 ENCOUNTER — Encounter: Payer: Self-pay | Admitting: Family Medicine

## 2021-02-14 DIAGNOSIS — G4700 Insomnia, unspecified: Secondary | ICD-10-CM

## 2021-02-15 MED ORDER — ZOLPIDEM TARTRATE 10 MG PO TABS: ORAL_TABLET | ORAL | 2 refills | Status: DC

## 2021-04-26 ENCOUNTER — Other Ambulatory Visit: Payer: Self-pay | Admitting: Family Medicine

## 2021-04-26 DIAGNOSIS — F411 Generalized anxiety disorder: Secondary | ICD-10-CM

## 2021-04-26 DIAGNOSIS — R232 Flushing: Secondary | ICD-10-CM

## 2021-04-27 ENCOUNTER — Encounter: Payer: Self-pay | Admitting: Family Medicine

## 2021-04-27 DIAGNOSIS — R232 Flushing: Secondary | ICD-10-CM

## 2021-04-27 DIAGNOSIS — F411 Generalized anxiety disorder: Secondary | ICD-10-CM

## 2021-04-27 MED ORDER — VENLAFAXINE HCL ER 75 MG PO CP24: 75.0000 mg | ORAL_CAPSULE | Freq: Every day | ORAL | 3 refills | Status: DC

## 2021-04-27 NOTE — Telephone Encounter (Signed)
Please advise on dose of venlafaxine, is Pt to be taking both?

## 2021-05-10 ENCOUNTER — Encounter: Payer: Self-pay | Admitting: Family Medicine

## 2021-05-11 NOTE — Progress Notes (Addendum)
[facility], [place], [place] [address] [phone]  Date:  05/14/2021   Name:  Shirley Holder   DOB:  Apr 26, 1970   MRN:  [id number]  PCP:  Darreld Mclean, MD    Chief Complaint: Dizziness (Every since her cruise in September. )   History of Present Illness:  Shirley Holder is a 51 y.o. very pleasant female patient who presents with the following:  Pt seen today with concern of dizziness Last sen by myself in May History of osteopenia, hyperlipidemia, breast cancer with left mastectomy 2011, vitamin D deficiency, hypothyroidism, hepatitis C status post curative treatment, insomnia uses Ambien  I started her on effexor back in May  She contacted me last week with concern of being dizzy- not sure if due to effexor or something else  However, because she started the Effexor well before her dizziness began she suspects this may not be the cause  She notes that she went on a cruise in [place] began feeling seasick/dizzy during her trip and the Elizabeth feeling never seemed to quite go away Her sx will wax and wane-she notes worse symptoms if she moves her head quickly, such as if she bends down and then stands up and tries to walk fast She is not lightheaded- it is vertigo  It is causing some anxiety No vomiting   She feels unmotivated and like she may be more depressed No SI   Over this last weekend she had some abd pressure- now resolved.  She also has pain after she urinated -she drank cranberry juice and this also seems better now   She uses ambien- not every night but a fair amount of time  Patient Active Problem List   Diagnosis Date Noted   Greater trochanteric pain syndrome of left lower extremity 01/12/2019   Low back pain radiating to left leg 09/21/2017   History of hepatitis C 02/01/2016   Hyperlipidemia 09/21/2015   History of ductal carcinoma in situ (DCIS) of breast 09/21/2015    Osteopenia 09/21/2015   Vitamin D deficiency 09/21/2015   Hyperthyroidism 09/21/2015   Insomnia 08/14/2015   Anxiety state 08/14/2015    Past Medical History:  Diagnosis Date   Breast cancer ([place]) 2011   Left Breast Cancer   Cancer ([place]) 2011   Left Breast Cancer   Hepatitis C    Urinary tract infection     Past Surgical History:  Procedure Laterality Date   BREAST BIOPSY  2011   CHOLECYSTECTOMY  1991   MASTECTOMY Left 2011   NECK SURGERY  2007   Neck Fusion; Ruptured Disk   NOVASURE ABLATION  11/2014   REDUCTION MAMMAPLASTY Right 2011   TONSILLECTOMY  1988   TUBAL LIGATION      Social History   Tobacco Use   Smoking status: Never   Smokeless tobacco: Never  Substance Use Topics   Alcohol use: Yes    Alcohol/week: 2.0 standard drinks    Types: 2 Glasses of wine per week    Comment: Occasional   Drug use: No    Family History  Problem Relation Age of Onset   Heart disease Mother    Heart disease Father    Heart attack Father    Breast cancer Maternal Aunt    Breast cancer Maternal Aunt    Diabetes Neg Hx     No Known Allergies  Medication list has been reviewed and updated.  Current Outpatient Medications on File Prior to Visit  Medication Sig Dispense Refill   venlafaxine XR (EFFEXOR XR) 75 MG 24 hr capsule Take 1 capsule (75 mg total) by mouth daily with breakfast. 90 capsule 3   zolpidem (AMBIEN) 10 MG tablet Take 5 mg by mouth at bedtime as needed for insomnia. 15 tablet 2   No current facility-administered medications on file prior to visit.    Review of Systems:  As per HPI- otherwise negative.   Physical Examination: Vitals:   05/14/21 1514  BP: 124/80  Pulse: 79  Resp: 18  Temp: 98.4 F (36.9 C)  SpO2: 99%   Vitals:   05/14/21 1514  Weight: 173 lb 6.4 oz (78.7 kg)  Height: 5' 4"  (1.626 m)   Body mass index is 29.76 kg/m. Ideal Body Weight: Weight in (lb) to have BMI = 25: 145.3  GEN: no acute distress.  Overweight, looks  well HEENT: Atraumatic, Normocephalic.  Ears and Nose: No external deformity. CV: RRR, No M/G/R. No JVD. No thrill. No extra heart sounds. PULM: CTA B, no wheezes, crackles, rhonchi. No retractions. No resp. distress. No accessory muscle use. ABD: S, NT, ND, +BS. No rebound. No HSM. EXTR: No c/c/e PSYCH: Normally interactive. Conversant.  Normal strength, sensation, DTR of all extremities.  Negative Romberg.  Positive Dix-Hallpike to the right only  Assessment and Plan: Hyperthyroidism - Plan: TSH  Mixed hyperlipidemia  Vertigo - Plan: CBC, Basic metabolic panel, Ambulatory referral to Physical Therapy  Dysuria - Plan: Urine Culture  Family history of premature CAD - Plan: Ambulatory referral to Cardiology  Insomnia, unspecified type - Plan: zolpidem (AMBIEN) 10 MG tablet  Need for influenza vaccination - Plan: Flu Vaccine QUAD 6+ mos PF IM (Fluarix Quad PF)  Patient seen today for concern of dizziness and a few other things.  She had dysuria over the weekend, now resolved.  Urine culture pending Gave flu shot, refilled her Ambien Suspect she has BPPV, perhaps precipitated by her ocean cruise.  Referral made to physical therapy for possible repositioning maneuvers Patient is feeling somewhat depressed but she thinks this may be due to to her vertigo.  We will see if this improves once her vertigo is treated Will plan further follow- up pending labs.   Signed Lamar Blinks, MD  Addendum 1/24, received labs as below.  Message to patient  Results for orders placed or performed in visit on 05/14/21  CBC  Result Value Ref Range   WBC 5.6 4.0 - 10.5 K/uL   RBC 4.69 3.87 - 5.11 Mil/uL   Platelets 208.0 150.0 - 400.0 K/uL   Hemoglobin 13.6 12.0 - 15.0 g/dL   HCT 41.3 36.0 - 46.0 %   MCV 87.9 78.0 - 100.0 fl   MCHC 33.0 30.0 - 36.0 g/dL   RDW 13.7 11.5 - 66.2 %  Basic metabolic panel  Result Value Ref Range   Sodium 139 135 - 145 mEq/L   Potassium 4.1 3.5 - 5.1 mEq/L    Chloride 101 96 - 112 mEq/L   CO2 31 19 - 32 mEq/L   Glucose, Bld 79 70 - 99 mg/dL   BUN 13 6 - 23 mg/dL   Creatinine, Ser 0.74 0.40 - 1.20 mg/dL   GFR 93.72 >60.00 mL/min   Calcium 9.6 8.4 - 10.5 mg/dL  TSH  Result Value Ref Range   TSH 1.73 0.35 - 5.50 uIU/mL

## 2021-05-14 ENCOUNTER — Encounter: Payer: Self-pay | Admitting: Family Medicine

## 2021-05-14 ENCOUNTER — Ambulatory Visit (INDEPENDENT_AMBULATORY_CARE_PROVIDER_SITE_OTHER): Payer: 59 | Admitting: Family Medicine

## 2021-05-14 VITALS — BP 124/80 | HR 79 | Temp 98.4°F | Resp 18 | Ht 64.0 in | Wt 173.4 lb

## 2021-05-14 DIAGNOSIS — Z23 Encounter for immunization: Secondary | ICD-10-CM

## 2021-05-14 DIAGNOSIS — Z8249 Family history of ischemic heart disease and other diseases of the circulatory system: Secondary | ICD-10-CM

## 2021-05-14 DIAGNOSIS — E0590 Thyrotoxicosis, unspecified without thyrotoxic crisis or storm: Secondary | ICD-10-CM | POA: Diagnosis not present

## 2021-05-14 DIAGNOSIS — R42 Dizziness and giddiness: Secondary | ICD-10-CM | POA: Diagnosis not present

## 2021-05-14 DIAGNOSIS — G4700 Insomnia, unspecified: Secondary | ICD-10-CM

## 2021-05-14 DIAGNOSIS — E782 Mixed hyperlipidemia: Secondary | ICD-10-CM

## 2021-05-14 DIAGNOSIS — R300 Dysuria: Secondary | ICD-10-CM | POA: Diagnosis not present

## 2021-05-14 MED ORDER — ZOLPIDEM TARTRATE 10 MG PO TABS: ORAL_TABLET | ORAL | 2 refills | Status: DC

## 2021-05-14 NOTE — Patient Instructions (Signed)
Good to see you today- I will be in touch with your blood work and your urine culture We will try physical therapy for your vertigo- let me know if this does not resolve your symptoms Referral to cardiology to discuss family history of heart disease

## 2021-05-15 ENCOUNTER — Encounter: Payer: Self-pay | Admitting: Family Medicine

## 2021-05-15 LAB — BASIC METABOLIC PANEL
BUN: 13 mg/dL (ref 6–23)
CO2: 31 mEq/L (ref 19–32)
Calcium: 9.6 mg/dL (ref 8.4–10.5)
Chloride: 101 mEq/L (ref 96–112)
Creatinine, Ser: 0.74 mg/dL (ref 0.40–1.20)
GFR: 93.72 mL/min (ref >60.00)
Glucose, Bld: 79 mg/dL (ref 70–99)
Potassium: 4.1 mEq/L (ref 3.5–5.1)
Sodium: 139 mEq/L (ref 135–145)

## 2021-05-15 LAB — URINE CULTURE
MICRO NUMBER:: 12905475
Result:: NO GROWTH
SPECIMEN QUALITY:: ADEQUATE

## 2021-05-15 LAB — CBC
HCT: 41.3 % (ref 36.0–46.0)
Hemoglobin: 13.6 g/dL (ref 12.0–15.0)
MCHC: 33 g/dL (ref 30.0–36.0)
MCV: 87.9 fl (ref 78.0–100.0)
Platelets: 208 10*3/uL (ref 150.0–400.0)
RBC: 4.69 Mil/uL (ref 3.87–5.11)
RDW: 13.7 % (ref 11.5–15.5)
WBC: 5.6 10*3/uL (ref 4.0–10.5)

## 2021-05-15 LAB — TSH: TSH: 1.73 u[IU]/mL (ref 0.35–5.50)

## 2021-05-16 ENCOUNTER — Encounter: Payer: Self-pay | Admitting: Family Medicine

## 2021-05-25 ENCOUNTER — Other Ambulatory Visit: Payer: Self-pay

## 2021-05-25 ENCOUNTER — Ambulatory Visit: Payer: 59 | Attending: Family Medicine

## 2021-05-25 VITALS — BP 135/87 | HR 72

## 2021-05-25 DIAGNOSIS — R42 Dizziness and giddiness: Secondary | ICD-10-CM | POA: Diagnosis present

## 2021-05-25 DIAGNOSIS — R2681 Unsteadiness on feet: Secondary | ICD-10-CM | POA: Insufficient documentation

## 2021-05-25 NOTE — Therapy (Signed)
[facility] [address] [place], [place], [address] Phone: [phone]   Fax:  [phone]  Physical Therapy Evaluation  Patient Details  Name: Shirley Holder MRN: [id number] Date of Birth: Jan 28, 1971 Referring Provider (PT): Lamar Blinks, MD   Encounter Date: 05/25/2021   PT End of Session - 05/25/21 1101     Visit Number 1    Number of Visits 9    Date for PT Re-Evaluation 07/27/21    Authorization Type UHC (VL: 60)    PT Start Time 1101    PT Stop Time 1143    PT Time Calculation (min) 42 min    Activity Tolerance Patient tolerated treatment well    Behavior During Therapy [facility] for tasks assessed/performed             Past Medical History:  Diagnosis Date   Breast cancer ([place]) 2011   Left Breast Cancer   Cancer ([place]) 2011   Left Breast Cancer   Hepatitis C    Urinary tract infection     Past Surgical History:  Procedure Laterality Date   BREAST BIOPSY  2011   CHOLECYSTECTOMY  1991   MASTECTOMY Left 2011   NECK SURGERY  2007   Neck Fusion; Ruptured Disk   NOVASURE ABLATION  11/2014   REDUCTION MAMMAPLASTY Right 2011   TONSILLECTOMY  1988   TUBAL LIGATION      Vitals:   05/25/21 1114  BP: 135/87  Pulse: 72      Subjective Assessment - 05/25/21 1103     Subjective Patient reports she went on a cruise in Sept and reports that since getting off the boat has been having symptoms. Reports she did have a cold after getting off the boat. Denies spinning. Reports some lightheaded sensation. Reports symptoms are worse on some days vs. others. Reports that it increases with quicker head/body movements or bending down. Denies dizziness with bed mobility. Reports she feels it the most when she is up and moving vs. seated. Denies vision/hearing changes, denies nausea or tinnitus. Reports that she does not have as much energy to get up and do things as she used too. Does have some HA/pressure in the front  of the head.    Pertinent History osteopenia, hyperlipidemia, breast cancer with left mastectomy 2011, vitamin D deficiency, hypothyroidism, hepatitis C    Limitations Standing;Walking    Patient Stated Goals Resolve the dizziness; feel more steady with activities    Currently in Pain? No/denies                [facility] PT Assessment - 05/25/21 0001       Assessment   Medical Diagnosis Vertigo    Referring Provider (PT) Lamar Blinks, MD    Onset Date/Surgical Date 05/14/21   referral date; started in Sept 2022   Prior Therapy No Vestibular Rehab      Precautions   Precautions Other (comment)    Precaution Comments osteopenia, hyperlipidemia, breast cancer with left mastectomy 2011, vitamin D deficiency, hypothyroidism, hepatitis C      Restrictions   Weight Bearing Restrictions No      Balance Screen   Has the patient fallen in the past 6 months No    Has the patient had a decrease in activity level because of a fear of falling?  No    Is the patient reluctant to leave their home because of a fear of falling?  No      Home Environment  Living Environment Private residence    Additional Comments Denies diffiuclty getting in/around stairs      Prior Function   Level of Independence Independent    Vocation Full time employment    Geophysicist/field seismologist [employer]      Cognition   Overall Cognitive Status Within Functional Limits for tasks assessed      Observation/Other Assessments   Focus on Therapeutic Outcomes Human resources officer)  Staff Did Not Capture on Eval      Sensation   Light Touch Appears Intact      Coordination   Gross Motor Movements are Fluid and Coordinated Yes      ROM / Strength   AROM / PROM / Strength Strength      Strength   Overall Strength Within functional limits for tasks performed      Transfers   Transfers Sit to Stand;Stand to Sit    Sit to Stand 7: Independent    Stand to Sit 7: Independent      Ambulation/Gait   Ambulation/Gait Yes     Ambulation/Gait Assistance 7: Independent    Assistive device None    Gait Pattern Within Functional Limits    Ambulation Surface Level;Indoor      Functional Gait  Assessment   Gait assessed  Yes    Gait Level Surface Walks 20 ft in less than 7 sec but greater than 5.5 sec, uses assistive device, slower speed, mild gait deviations, or deviates 6-10 in outside of the 12 in walkway width.    Change in Gait Speed Able to smoothly change walking speed without loss of balance or gait deviation. Deviate no more than 6 in outside of the 12 in walkway width.    Gait with Horizontal Head Turns Performs head turns smoothly with slight change in gait velocity (eg, minor disruption to smooth gait path), deviates 6-10 in outside 12 in walkway width, or uses an assistive device.   mild dizziness   Gait with Vertical Head Turns Performs task with slight change in gait velocity (eg, minor disruption to smooth gait path), deviates 6 - 10 in outside 12 in walkway width or uses assistive device   mild dizziness   Gait and Pivot Turn Pivot turns safely within 3 sec and stops quickly with no loss of balance.   mild dizziness   Step Over Obstacle Is able to step over 2 stacked shoe boxes taped together (9 in total height) without changing gait speed. No evidence of imbalance.    Gait with Narrow Base of Support Is able to ambulate for 10 steps heel to toe with no staggering.    Gait with Eyes Closed Walks 20 ft, uses assistive device, slower speed, mild gait deviations, deviates 6-10 in outside 12 in walkway width. Ambulates 20 ft in less than 9 sec but greater than 7 sec.   mild dizziness   Ambulating Backwards Walks 20 ft, no assistive devices, good speed, no evidence for imbalance, normal gait    Steps Alternating feet, no rail.    Total Score 26    FGA comment: 26/30                    Vestibular Assessment - 05/25/21 0001       Symptom Behavior   Subjective history of current problem See  Subjective    Type of Dizziness  Imbalance;Lightheadedness;Unsteady with head/body turns    Frequency of Dizziness daily with movement    Duration of Dizziness duration of  movement    Symptom Nature Motion provoked;Intermittent    Aggravating Factors Turning body quickly;Turning head quickly;Activity in general;Forward bending    Relieving Factors Rest    Progression of Symptoms No change since onset    History of similar episodes None      Oculomotor Exam   Oculomotor Alignment Normal    Ocular ROM WNL    Spontaneous Absent    Gaze-induced  Absent    Smooth Pursuits Intact    Saccades Intact      Oculomotor Exam-Fixation Suppressed    Left Head Impulse Positive    Right Head Impulse Negative      Vestibulo-Ocular Reflex   VOR 1 Head Only (x 1 viewing) Mild Dizziness. Able to maintain eyes focused    VOR Cancellation Normal   reports mild dizziness     Visual Acuity   Static 11    Dynamic 8      Other Tests   Comments Completed M-CTSIB: able to hold situation 1-4 for full 30 seconds; increased postural sway noted especially with vision removed.      Positional Testing   Dix-Hallpike Dix-Hallpike Right;Dix-Hallpike Left      Dix-Hallpike Right   Dix-Hallpike Right Duration 0    Dix-Hallpike Right Symptoms No nystagmus      Dix-Hallpike Left   Dix-Hallpike Left Duration 0    Dix-Hallpike Left Symptoms No nystagmus      Positional Sensitivities   Sit to Supine No dizziness    Supine to Left Side No dizziness    Supine to Right Side No dizziness    Supine to Sitting No dizziness    Right Hallpike No dizziness    Up from Right Hallpike No dizziness    Up from Left Hallpike No dizziness    Nose to Right Knee No dizziness    Right Knee to Sitting No dizziness    Nose to Left Knee No dizziness    Left Knee to Sitting Lightheadedness    Head Turning x 5 Moderate dizziness   in standing; feels woozy   Head Nodding x 5 Moderate dizziness   in standing; more significant  dizziness vs. horizontal   Pivot Right in Standing No dizziness    Pivot Left in Standing No dizziness    Rolling Right No dizziness    Rolling Left No dizziness                Objective measurements completed on examination: See above findings.                PT Education - 05/25/21 1114     Education Details Educated on [employer]) Educated Patient    Methods Explanation    Comprehension Verbalized understanding              PT Short Term Goals - 05/25/21 1249       PT SHORT TERM GOAL #1   Title Pt will be independent with initial vestibular/balance HEP    Baseline to be established    Time 4    Period Weeks    Status New    Target Date 06/29/21      PT SHORT TERM GOAL #2   Title SOT TBA and LTG to be set as applicable    Baseline TBA    Time 4    Period Weeks    Status New  PT Long Term Goals - 05/25/21 1256       PT LONG TERM GOAL #1   Title patient will be independent with final vestibular/balance HEP    Baseline to be established    Time 8    Period Weeks    Status New    Target Date 07/27/21      PT LONG TERM GOAL #2   Title LTG to be set for SOT as applicable    Baseline TBA    Time 8    Period Weeks    Status New      PT LONG TERM GOAL #3   Title Pt will improve FGA to >/= 29/30 to demo improved balance    Baseline 26/30    Time 8    Period Weeks    Status New      PT LONG TERM GOAL #4   Title Pt will improve DVA to </= 2 line to demo improved VOR    Baseline 3 line difference    Time 8    Period Weeks    Status New                    Plan - 05/25/21 1243     Clinical Impression Statement Patient is a 51 y.o. female referred to Neuro OPPT services for Vertigo. Patient's PMH includes the following: osteopenia, hyperlipidemia, breast cancer with left mastectomy 2011, vitamin D deficiency, hypothyroidism, hepatitis C. Patient presents with the following impairments  upon evaluation: dizziness, decreased activity tolerance, impaired balance, motion sensitivity, and abnormal DVA and positive HIT on L indicating impaired VOR. Patient scored 26/30 on FGA indicating low fall risk, but increased symptoms with head movement/body turns also noted on MSQ. Patient will beneift from skilled PT services to address impairments and improve activity tolerance.    Personal Factors and Comorbidities Comorbidity 3+;Time since onset of injury/illness/exacerbation    Comorbidities osteopenia, hyperlipidemia, breast cancer with left mastectomy 2011, vitamin D deficiency, hypothyroidism, hepatitis C.    Examination-Activity Limitations Locomotion Level;Stand    Examination-Participation Restrictions Cleaning;Occupation;Community Activity;Yard Work    Stability/Clinical Decision Making Stable/Uncomplicated    Designer, jewellery Low    Rehab Potential Good    PT Frequency 1x / week    PT Duration 8 weeks    PT Treatment/Interventions ADLs/Self Care Home Management;Canalith Repostioning;DME Instruction;Cryotherapy;Moist Heat;Therapeutic activities;Functional mobility training;Stair training;Gait training;Therapeutic exercise;Balance training;Neuromuscular re-education;Patient/family education;Manual techniques;Passive range of motion;Dry needling;Vestibular    PT Next Visit Plan Complete SOT. Establish HEP focused on VOR/Balance.    Consulted and Agree with Plan of Care Patient             Patient will benefit from skilled therapeutic intervention in order to improve the following deficits and impairments:  Decreased balance, Decreased activity tolerance, Dizziness  Visit Diagnosis: Dizziness and giddiness  Unsteadiness on feet     Problem List Patient Active Problem List   Diagnosis Date Noted   Greater trochanteric pain syndrome of left lower extremity 01/12/2019   Low back pain radiating to left leg 09/21/2017   History of hepatitis C 02/01/2016    Hyperlipidemia 09/21/2015   History of ductal carcinoma in situ (DCIS) of breast 09/21/2015   Osteopenia 09/21/2015   Vitamin D deficiency 09/21/2015   Hyperthyroidism 09/21/2015   Insomnia 08/14/2015   Anxiety state 08/14/2015    Jones Bales, PT, DPT 05/25/2021, 12:58 PM  [facility] [address] [place], [place], [address] Phone:  [phone]   Fax:  [phone]  Name: Shirley Holder MRN: [id number] Date of Birth: [birth year]

## 2021-05-25 NOTE — Addendum Note (Signed)
Addended by: Baldomero Lamy B on: 05/25/2021 01:03 PM   Modules accepted: Orders

## 2021-06-04 ENCOUNTER — Ambulatory Visit: Payer: 59

## 2021-06-12 ENCOUNTER — Other Ambulatory Visit: Payer: Self-pay

## 2021-06-12 ENCOUNTER — Ambulatory Visit: Payer: 59

## 2021-06-12 DIAGNOSIS — R42 Dizziness and giddiness: Secondary | ICD-10-CM | POA: Diagnosis not present

## 2021-06-12 DIAGNOSIS — R2681 Unsteadiness on feet: Secondary | ICD-10-CM

## 2021-06-12 NOTE — Therapy (Signed)
[facility] [address] [place], [place], [address] Phone: [phone]   Fax:  [phone]  Physical Therapy Treatment  Patient Details  Name: Shirley Holder MRN: [id number] Date of Birth: [birth year] Referring Provider (PT): Lamar Blinks, MD   Encounter Date: 06/12/2021   PT End of Session - 06/12/21 1616     Visit Number 2    Number of Visits 9    Date for PT Re-Evaluation 07/27/21    Authorization Type UHC (VL: 50)    PT Start Time 1616    PT Stop Time 1655    PT Time Calculation (min) 39 min    Activity Tolerance Patient tolerated treatment well    Behavior During Therapy [facility] for tasks assessed/performed             Past Medical History:  Diagnosis Date   Breast cancer ([place]) 2011   Left Breast Cancer   Cancer [facility]) 2011   Left Breast Cancer   Hepatitis C    Urinary tract infection     Past Surgical History:  Procedure Laterality Date   BREAST BIOPSY  2011   CHOLECYSTECTOMY  1991   MASTECTOMY Left 2011   NECK SURGERY  2007   Neck Fusion; Ruptured Disk   NOVASURE ABLATION  11/2014   REDUCTION MAMMAPLASTY Right 2011   TONSILLECTOMY  1988   TUBAL LIGATION      There were no vitals filed for this visit.   Subjective Assessment - 06/12/21 1617     Subjective Patient reports that she had a bad day yesterday. Reports continues to feel off balance when up and moving. Feels like her anxiety has increased do this. No falls to report.    Pertinent History osteopenia, hyperlipidemia, breast cancer with left mastectomy 2011, vitamin D deficiency, hypothyroidism, hepatitis C    Limitations Standing;Walking    Patient Stated Goals Resolve the dizziness; feel more steady with activities    Currently in Pain? No/denies              Neuro re-ed: sensory organization test performed with following results: Conditions: 1: all above age related norms 2: all above age related norms 3: all above age  related norms  4: all above age related norms 5: all above age related norms 6: all above age related norms Composite score: 81% (WNL for age)  Sensory Analysis Som: Within Normal Limits Vis: Within Normal Limits Vest: Within Normal Limits Pref: 100%  Strategy analysis: Ankle Dominant       COG alignment: COG Midline         Vestibular Treatment/Exercise - 06/12/21 0001       Vestibular Treatment/Exercise   Vestibular Treatment Provided Gaze    Gaze Exercises X1 Viewing Horizontal;X1 Viewing Vertical      X1 Viewing Horizontal   Foot Position seated    Reps 2    Comments x 30 seconds, seated, mild dizziness and reports increased pressure in head      X1 Viewing Vertical   Foot Position seated    Reps 2    Comments x 30 seconds; reports mild wave feeling with completion            Established HEP, handout provided:   Gaze Stabilization: Sitting    Keeping eyes on target on wall 3-4 feet away, tilt head down 15-30 and move head side to side for 30 seconds. Repeat while moving head up and down for 30 seconds. Do 2-3 sessions per  day.   Gaze Stabilization: Tip Card  1.Target must remain in focus, not blurry, and appear stationary while head is in motion. 2.Perform exercises with small head movements (45 to either side of midline). 3.Increase speed of head motion so long as target is in focus. 4.If you wear eyeglasses, be sure you can see target through lens (therapist will give specific instructions for bifocal / progressive lenses). 5.These exercises may provoke dizziness or nausea. Work through these symptoms. If too dizzy, slow head movement slightly. Rest between each exercise. 6.Exercises demand concentration; avoid distractions. 7.For safety, perform standing exercises close to a counter, wall, corner, or next to someone.  Copyright  VHI. All rights reserved.         PT Education - 06/12/21 1649     Education Details VOR HEP; SOT Results     Person(s) Educated Patient    Methods Explanation;Demonstration;Handout    Comprehension Returned demonstration;Verbalized understanding              PT Short Term Goals - 05/25/21 1249       PT SHORT TERM GOAL #1   Title Pt will be independent with initial vestibular/balance HEP    Baseline to be established    Time 4    Period Weeks    Status New    Target Date 06/29/21      PT SHORT TERM GOAL #2   Title SOT TBA and LTG to be set as applicable    Baseline TBA    Time 4    Period Weeks    Status New               PT Long Term Goals - 05/25/21 1256       PT LONG TERM GOAL #1   Title patient will be independent with final vestibular/balance HEP    Baseline to be established    Time 8    Period Weeks    Status New    Target Date 07/27/21      PT LONG TERM GOAL #2   Title LTG to be set for SOT as applicable    Baseline TBA    Time 8    Period Weeks    Status New      PT LONG TERM GOAL #3   Title Pt will improve FGA to >/= 29/30 to demo improved balance    Baseline 26/30    Time 8    Period Weeks    Status New      PT LONG TERM GOAL #4   Title Pt will improve DVA to </= 2 line to demo improved VOR    Baseline 3 line difference    Time 8    Period Weeks    Status New                   Plan - 06/12/21 1616     Clinical Impression Statement Today's skilled PT session focused on further balance assesment with Sensory Organization Test. Patient composite score today was 81% (WNL). All components of SOT above age related norms. Rest of session spent establishining initial VOR x 1 HEP. Patient tolerating well with intermittent rest due to dizziness. WIll continue per POC.    Personal Factors and Comorbidities Comorbidity 3+;Time since onset of injury/illness/exacerbation    Comorbidities osteopenia, hyperlipidemia, breast cancer with left mastectomy 2011, vitamin D deficiency, hypothyroidism, hepatitis C.    Examination-Activity Limitations  Locomotion Level;Stand    Examination-Participation Restrictions Cleaning;Occupation;Community  Activity;Yard Work    Stability/Clinical Decision Making Stable/Uncomplicated    Rehab Potential Good    PT Frequency 1x / week    PT Duration 8 weeks    PT Treatment/Interventions ADLs/Self Care Home Management;Canalith Repostioning;DME Instruction;Cryotherapy;Moist Heat;Therapeutic activities;Functional mobility training;Stair training;Gait training;Therapeutic exercise;Balance training;Neuromuscular re-education;Patient/family education;Manual techniques;Passive range of motion;Dry needling;Vestibular    PT Next Visit Plan Review VOR and progress. Add in habituation to head movement, bending. High Level Balance.    Consulted and Agree with Plan of Care Patient             Patient will benefit from skilled therapeutic intervention in order to improve the following deficits and impairments:  Decreased balance, Decreased activity tolerance, Dizziness  Visit Diagnosis: Dizziness and giddiness  Unsteadiness on feet     Problem List Patient Active Problem List   Diagnosis Date Noted   Greater trochanteric pain syndrome of left lower extremity 01/12/2019   Low back pain radiating to left leg 09/21/2017   History of hepatitis C 02/01/2016   Hyperlipidemia 09/21/2015   History of ductal carcinoma in situ (DCIS) of breast 09/21/2015   Osteopenia 09/21/2015   Vitamin D deficiency 09/21/2015   Hyperthyroidism 09/21/2015   Insomnia 08/14/2015   Anxiety state 08/14/2015    Jones Bales, PT, DPT 06/12/2021, 6:44 PM  [facility] [address] [place], [place], [address] Phone: [phone]   Fax:  [phone]  Name: Shirley Holder MRN: [id number] Date of Birth: 08-10-1970

## 2021-06-12 NOTE — Patient Instructions (Signed)
Gaze Stabilization: Sitting    Keeping eyes on target on wall 3-4 feet away, tilt head down 15-30 and move head side to side for 30 seconds. Repeat while moving head up and down for 30 seconds. Do 2-3 sessions per day.   Gaze Stabilization: Tip Card  1.Target must remain in focus, not blurry, and appear stationary while head is in motion. 2.Perform exercises with small head movements (45 to either side of midline). 3.Increase speed of head motion so long as target is in focus. 4.If you wear eyeglasses, be sure you can see target through lens (therapist will give specific instructions for bifocal / progressive lenses). 5.These exercises may provoke dizziness or nausea. Work through these symptoms. If too dizzy, slow head movement slightly. Rest between each exercise. 6.Exercises demand concentration; avoid distractions. 7.For safety, perform standing exercises close to a counter, wall, corner, or next to someone.  Copyright  VHI. All rights reserved.

## 2021-06-21 ENCOUNTER — Ambulatory Visit: Payer: 59 | Attending: Family Medicine

## 2021-06-21 ENCOUNTER — Other Ambulatory Visit: Payer: Self-pay

## 2021-06-21 DIAGNOSIS — R42 Dizziness and giddiness: Secondary | ICD-10-CM | POA: Diagnosis present

## 2021-06-21 DIAGNOSIS — R2681 Unsteadiness on feet: Secondary | ICD-10-CM | POA: Diagnosis present

## 2021-06-21 NOTE — Therapy (Signed)
[facility] [address] [place], [place], [address] Phone: [phone]   Fax:  [phone]  Physical Therapy Treatment  Patient Details  Name: Shirley Holder MRN: [id number] Date of Birth: 01-01-1971 Referring Provider (PT): Lamar Blinks, MD   Encounter Date: 06/21/2021   PT End of Session - 06/21/21 1401     Visit Number 3    Number of Visits 9    Date for PT Re-Evaluation 07/27/21    Authorization Type UHC (VL: 60)    PT Start Time 1401    PT Stop Time 8588    PT Time Calculation (min) 41 min    Activity Tolerance Patient tolerated treatment well    Behavior During Therapy [facility] for tasks assessed/performed             Past Medical History:  Diagnosis Date   Breast cancer ([place]) 2011   Left Breast Cancer   Cancer [facility]) 2011   Left Breast Cancer   Hepatitis C    Urinary tract infection     Past Surgical History:  Procedure Laterality Date   BREAST BIOPSY  2011   CHOLECYSTECTOMY  1991   MASTECTOMY Left 2011   NECK SURGERY  2007   Neck Fusion; Ruptured Disk   NOVASURE ABLATION  11/2014   REDUCTION MAMMAPLASTY Right 2011   TONSILLECTOMY  1988   TUBAL LIGATION      There were no vitals filed for this visit.   Subjective Assessment - 06/21/21 1402     Subjective Patient reports has had a good week, dizziness has been very minimal. Reports she notices the dizziness/wooziness more with exercises when turning to the right. No other new changes.    Pertinent History osteopenia, hyperlipidemia, breast cancer with left mastectomy 2011, vitamin D deficiency, hypothyroidism, hepatitis C    Limitations Standing;Walking    Patient Stated Goals Resolve the dizziness; feel more steady with activities    Currently in Pain? Yes    Pain Score 5     Pain Location Neck    Pain Orientation Right;Posterior    Pain Descriptors / Indicators Aching    Pain Type Acute pain    Pain Onset Today    Pain Frequency  Intermittent                 [facility] Adult PT Treatment/Exercise - 06/21/21 0001       Neuro Re-ed    Neuro Re-ed Details  Completed habituation to forward bending from standing position, completed x 6 reps with mild-mod dizziness. requiring standing rest break. Then completed additional 2 set x 6 reps. With increased repitions improvements noted in dizziness sensation.             Vestibular Treatment/Exercise - 06/21/21 0001       Vestibular Treatment/Exercise   Vestibular Treatment Provided Gaze    Gaze Exercises X1 Viewing Horizontal;X1 Viewing Vertical      X1 Viewing Horizontal   Foot Position Seated > Standing    Reps 3    Comments x 30 seconds, x 45 seconds, seated, mild dizziness and reports increased pressure in head with standing activity.      X1 Viewing Vertical   Foot Position Seated > Standing    Reps 3    Comments x 30 seconds, x 45 seconds, seated, mild dizziness and reports increased pressure in head with standing activity. increased symptoms.  Balance Exercises - 06/21/21 0001       Balance Exercises: Standing   Standing Eyes Opened Narrow base of support (BOS);Head turns;Foam/compliant surface;Limitations    Standing Eyes Opened Limitations standing narrow BOS with EO, completed horizontal/vertical head turns x 10 reps each. Reports increased dizziness with completion.    Standing Eyes Closed Narrow base of support (BOS);Foam/compliant surface;3 reps;30 secs;Limitations;Wide (BOA);Head turns    Standing Eyes Closed Limitations standing on airex eyes closed, 3 x 30 seconds. then with wide BOS completed EC and horizontal/vertical head turns x 10 reps each direction. more challenge with horiz > vertical.    Gait with Head Turns Forward;3 reps;Limitations    Gait with Head Turns Limitations completd forward ambulation with horizontal head turns and vertical head turns, 2 x 35' each. mild unsteadines noted.            Updated  HEP, provided the following:   Access Code: [email or web address] URL: [email or web address] Date: 06/21/2021 Prepared by: Baldomero Lamy  Exercises Romberg Stance Eyes Closed on Foam Pad - 1 x daily - 5 x weekly - 1 sets - 3 reps - 30 seconds hold Romberg Stance on Foam Pad with Head Rotation - 1 x daily - 5 x weekly - 2 sets - 10 reps Romberg Stance with Head Nods on Foam Pad - 1 x daily - 5 x weekly - 2 sets - 10 reps Walking with Head Rotation - 1 x daily - 5 x weekly - 1 sets - 3-4 reps      PT Education - 06/21/21 1437     Education Details HEP UPdate    Person(s) Educated Patient    Methods Explanation;Demonstration;Handout    Comprehension Returned demonstration;Verbalized understanding              PT Short Term Goals - 05/25/21 1249       PT SHORT TERM GOAL #1   Title Pt will be independent with initial vestibular/balance HEP    Baseline to be established    Time 4    Period Weeks    Status New    Target Date 06/29/21      PT SHORT TERM GOAL #2   Title SOT TBA and LTG to be set as applicable    Baseline TBA    Time 4    Period Weeks    Status New               PT Long Term Goals - 05/25/21 1256       PT LONG TERM GOAL #1   Title patient will be independent with final vestibular/balance HEP    Baseline to be established    Time 8    Period Weeks    Status New    Target Date 07/27/21      PT LONG TERM GOAL #2   Title LTG to be set for SOT as applicable    Baseline TBA    Time 8    Period Weeks    Status New      PT LONG TERM GOAL #3   Title Pt will improve FGA to >/= 29/30 to demo improved balance    Baseline 26/30    Time 8    Period Weeks    Status New      PT LONG TERM GOAL #4   Title Pt will improve DVA to </= 2 line to demo improved VOR    Baseline 3 line difference    Time 8  Period Weeks    Status New                   Plan - 06/21/21 1401     Clinical Impression Statement Completed review of VOR HEP  provided at last session, and continued to progress to patients tolerance. Trialed balance today with focus on improved vestibular input. Added to HEP. Pt tolerating well iwth intermittent rest breaks due to dizziness. PT educating on potential referral to ENT, due to patient worried about sinus contribution. Will continue per POC.    Personal Factors and Comorbidities Comorbidity 3+;Time since onset of injury/illness/exacerbation    Comorbidities osteopenia, hyperlipidemia, breast cancer with left mastectomy 2011, vitamin D deficiency, hypothyroidism, hepatitis C.    Examination-Activity Limitations Locomotion Level;Stand    Examination-Participation Restrictions Cleaning;Occupation;Community Activity;Yard Work    Stability/Clinical Decision Making Stable/Uncomplicated    Rehab Potential Good    PT Frequency 1x / week    PT Duration 8 weeks    PT Treatment/Interventions ADLs/Self Care Home Management;Canalith Repostioning;DME Instruction;Cryotherapy;Moist Heat;Therapeutic activities;Functional mobility training;Stair training;Gait training;Therapeutic exercise;Balance training;Neuromuscular re-education;Patient/family education;Manual techniques;Passive range of motion;Dry needling;Vestibular    PT Next Visit Plan Continue VOR, habituation to head movement, bending. High Level Balance.    Consulted and Agree with Plan of Care Patient             Patient will benefit from skilled therapeutic intervention in order to improve the following deficits and impairments:  Decreased balance, Decreased activity tolerance, Dizziness  Visit Diagnosis: Dizziness and giddiness  Unsteadiness on feet     Problem List Patient Active Problem List   Diagnosis Date Noted   Greater trochanteric pain syndrome of left lower extremity 01/12/2019   Low back pain radiating to left leg 09/21/2017   History of hepatitis C 02/01/2016   Hyperlipidemia 09/21/2015   History of ductal carcinoma in situ (DCIS) of  breast 09/21/2015   Osteopenia 09/21/2015   Vitamin D deficiency 09/21/2015   Hyperthyroidism 09/21/2015   Insomnia 08/14/2015   Anxiety state 08/14/2015    Jones Bales, PT, DPT 06/21/2021, 2:43 PM  [facility] [address] [place], [place], [address] Phone: [phone]   Fax:  [phone]  Name: Shirley Holder MRN: [id number] Date of Birth: December 22, 1970

## 2021-06-21 NOTE — Patient Instructions (Signed)
Access Code: [email or web address] ?URL: [email or web address] ?Date: 06/21/2021 ?Prepared by: Baldomero Lamy ? ?Exercises ?Romberg Stance Eyes Closed on Foam Pad - 1 x daily - 5 x weekly - 1 sets - 3 reps - 30 seconds hold ?Romberg Stance on Foam Pad with Head Rotation - 1 x daily - 5 x weekly - 2 sets - 10 reps ?Romberg Stance with Head Nods on Foam Pad - 1 x daily - 5 x weekly - 2 sets - 10 reps ?Walking with Head Rotation - 1 x daily - 5 x weekly - 1 sets - 3-4 reps ? ?

## 2021-06-22 ENCOUNTER — Telehealth: Payer: Self-pay

## 2021-06-22 ENCOUNTER — Encounter: Payer: Self-pay | Admitting: Family Medicine

## 2021-06-22 DIAGNOSIS — R42 Dizziness and giddiness: Secondary | ICD-10-CM

## 2021-06-22 NOTE — Telephone Encounter (Signed)
Dr. Lorelei Pont, ? ?Shirley Holder is being treated by Physical Therapy for Vertigo. Patient is having some good response with PT services, however I believe the patient would benefit from further evaluation by an ENT.  ?  ?If you agree, can you please submit a referral.  ? ?Thank you,  ?Guillermina City, PT, DPT ? ?[facility] ?[address] ?[address] ?[place], [place]  [address] ?Phone:  [phone] ?Fax:  [phone] ? ? ?

## 2021-06-25 ENCOUNTER — Encounter: Payer: Self-pay | Admitting: Cardiology

## 2021-06-25 ENCOUNTER — Ambulatory Visit (INDEPENDENT_AMBULATORY_CARE_PROVIDER_SITE_OTHER): Payer: 59 | Admitting: Cardiology

## 2021-06-25 ENCOUNTER — Other Ambulatory Visit: Payer: Self-pay

## 2021-06-25 VITALS — BP 124/78 | HR 75 | Ht 64.0 in | Wt 176.0 lb

## 2021-06-25 DIAGNOSIS — R0609 Other forms of dyspnea: Secondary | ICD-10-CM

## 2021-06-25 DIAGNOSIS — R42 Dizziness and giddiness: Secondary | ICD-10-CM

## 2021-06-25 DIAGNOSIS — Z8249 Family history of ischemic heart disease and other diseases of the circulatory system: Secondary | ICD-10-CM

## 2021-06-25 DIAGNOSIS — E782 Mixed hyperlipidemia: Secondary | ICD-10-CM | POA: Diagnosis not present

## 2021-06-25 NOTE — Addendum Note (Signed)
Addended by: Edwyna Shell I on: 06/25/2021 09:31 AM ? ? Modules accepted: Orders ? ?

## 2021-06-25 NOTE — Progress Notes (Signed)
? ?Cardiology Consultation:   ? ?Date:  06/25/2021  ? ?[name], DOB [birth year], MRN [id number] ? ?PCP:  Darreld Mclean, MD  ?Cardiologist:  Jenne Campus, MD  ? ?Referring MD: Darreld Mclean, MD  ? ?Chief Complaint  ?Patient presents with  ? heart evaluation  ? ? ?History of Present Illness:   ? ?Shirley Holder is a 51 y.o. female who is being seen today for the evaluation of heart evaluation at the request of Copland, Gay Filler, MD. lady with past medical history significant for multiple family members having premature coronary artery disease.  Her father and she died at change of 59 because of massive heart attack, her mother also have quite extensive coronary artery disease with multiple interventions she started having problems he was in early 11s, also multiple siblings have problem with heart including multiple stenting.  Interestingly all his family members are smokers and she is not.  She does not have any symptoms suggesting active coronary artery disease but she would like to be checked to make sure she does everything when she can to prevent future events.  She is trying to be a little more active however does have a pinched nerve on the left leg therefore she cannot exercise too much.  She participated well in rehab for that.  She denies have any chest pain, tightness, pressure, burning in the chest.  She was few months ago on a cruise started having some sinus problems and dizziness that being still struggling with.  She is actually describes dizziness as feeling unsteady that last for sometimes minutes or hours.  There is no any palpitations associated with that.  She does not exercise on the regular basis she is not on any special diet she does not smoke never did. ? ?Past Medical History:  ?Diagnosis Date  ? Breast cancer [facility]) 2011  ? Left Breast Cancer  ? Cancer [facility]) 2011  ? Left Breast Cancer  ? Hepatitis C   ? Urinary tract infection   ? ? ?Past Surgical History:  ?Procedure  Laterality Date  ? BREAST BIOPSY  2011  ? CHOLECYSTECTOMY  1991  ? MASTECTOMY Left 2011  ? NECK SURGERY  2007  ? Neck Fusion; Ruptured Disk  ? NOVASURE ABLATION  11/2014  ? REDUCTION MAMMAPLASTY Right 2011  ? TONSILLECTOMY  1988  ? TUBAL LIGATION    ? ? ?Current Medications: ?Current Meds  ?Medication Sig  ? venlafaxine XR (EFFEXOR XR) 75 MG 24 hr capsule Take 1 capsule (75 mg total) by mouth daily with breakfast.  ? zolpidem (AMBIEN) 10 MG tablet Take 5 mg by mouth at bedtime as needed for insomnia. (Patient taking differently: Take 5 mg by mouth at bedtime as needed for sleep. Take 5 mg by mouth at bedtime as needed for insomnia.)  ?  ? ?Allergies:   Patient has no known allergies.  ? ?Social History  ? ?Socioeconomic History  ? Marital status: Married  ?  Spouse name: Not on file  ? Number of children: Not on file  ? Years of education: Not on file  ? Highest education level: Not on file  ?Occupational History  ? Not on file  ?Tobacco Use  ? Smoking status: Never  ? Smokeless tobacco: Never  ?Substance and Sexual Activity  ? Alcohol use: Yes  ?  Alcohol/week: 2.0 standard drinks  ?  Types: 2 Glasses of wine per week  ?  Comment: Occasional  ? Drug use: No  ?  Sexual activity: Yes  ?Other Topics Concern  ? Not on file  ?Social History Narrative  ? Not on file  ? ?Social Determinants of Health  ? ?Financial Resource Strain: Not on file  ?Food Insecurity: Not on file  ?Transportation Needs: Not on file  ?Physical Activity: Not on file  ?Stress: Not on file  ?Social Connections: Not on file  ?  ? ?Family History: ?The patient's family history includes Breast cancer in her maternal aunt and maternal aunt; Heart attack in her father; Heart disease in her father and mother. There is no history of Diabetes. ?ROS:   ?Please see the history of present illness.    ?All 14 point review of systems negative except as described per history of present illness. ? ?EKGs/Labs/Other Studies Reviewed:   ? ?The following studies were  reviewed today: ? ? ?EKG:  EKG is  ordered today.  The ekg ordered today demonstrates normal sinus rhythm, normal P interval, normal QS complex duration morphology no ST segment changes. ? ?Recent Labs: ?08/23/2020: ALT 30 ?05/14/2021: BUN 13; Creatinine, Ser 0.74; Hemoglobin 13.6; Platelets 208.0; Potassium 4.1; Sodium 139; TSH 1.73  ?Recent Lipid Panel ?   ?Component Value Date/Time  ? CHOL 204 (H) 08/23/2020 5284  ? TRIG 68.0 08/23/2020 0917  ? HDL 73.60 08/23/2020 0917  ? CHOLHDL 3 08/23/2020 0917  ? VLDL 13.6 08/23/2020 0917  ? [place] 116 (H) 08/23/2020 1324  ? ? ?Physical Exam:   ? ?VS:  BP 124/78 (BP Location: Right Arm, Patient Position: Sitting)   Pulse 75   Ht 5' 4"  (1.626 m)   Wt 176 lb (79.8 kg)   SpO2 96%   BMI 30.21 kg/m?    ? ?Wt Readings from Last 3 Encounters:  ?06/25/21 176 lb (79.8 kg)  ?05/14/21 173 lb 6.4 oz (78.7 kg)  ?08/23/20 174 lb (78.9 kg)  ?  ? ?GEN:  Well nourished, well developed in no acute distress ?HEENT: Normal ?NECK: No JVD; No carotid bruits ?LYMPHATICS: No lymphadenopathy ?CARDIAC: RRR, no murmurs, no rubs, no gallops ?RESPIRATORY:  Clear to auscultation without rales, wheezing or rhonchi  ?ABDOMEN: Soft, non-tender, non-distended ?MUSCULOSKELETAL:  No edema; No deformity  ?SKIN: Warm and dry ?NEUROLOGIC:  Alert and oriented x 3 ?PSYCHIATRIC:  Normal affect  ? ?ASSESSMENT:   ? ?1. Mixed hyperlipidemia   ?2. Dyspnea on exertion   ?3. Family history of coronary artery disease   ?4. Dizziness   ? ?PLAN:   ? ?In order of problems listed above: ? ?Family history with premature coronary artery disease we tried to find out what was the triggering event for a family members do not indicate identified his smoking which all of them did and this patient has not do it obviously I encouraged her to stay away from smoking.  I will check her LP(a), we did also discuss about potential testing on her heart.  She has absolutely no symptomatology therefore doing a stress test will not bring any  help.  We initiated conversation about potentially calcium score.  She is still relatively young to have calcium score done but she insist that she would like to know if there are any calcium deposits to call her if better and feel better.  Therefore we will do the test.  In the meantime I calculated her predicted 10 years risk based on her cholesterol profile blood pressure.  10 years risk is only 0.9% which is very low.  We did discuss issue of healthy lifestyle we  did discuss basic of Mediterranean diet.  I did recommend to exercises 5 times a week for at least half an hour of moderate intensity exercise. ?Dyspnea on exertion: Hopefully she will be able to start exercising on the regular basis which improve her stamina. ?Family history of premature coronary artery disease probably caused by smoking ?Dizziness looks like therapy.  She has follow-up with ENT ? ? ?Medication Adjustments/Labs and Tests Ordered: ?Current medicines are reviewed at length with the patient today.  Concerns regarding medicines are outlined above.  ?No orders of the defined types were placed in this encounter. ? ?No orders of the defined types were placed in this encounter. ? ? ?Signed, ?Park Liter, MD, [facility]. ?06/25/2021 9:21 AM    ?[facility] ?

## 2021-06-25 NOTE — Patient Instructions (Signed)
Medication Instructions:  ?Your physician recommends that you continue on your current medications as directed. Please refer to the Current Medication list given to you today. ? ?*If you need a refill on your cardiac medications before your next appointment, please call your pharmacy* ? ? ?Lab Work: ?Your physician recommends that you return for lab work in:  ? ?Labs today: LP(a) ? ?If you have labs (blood work) drawn today and your tests are completely normal, you will receive your results only by: ?MyChart Message (if you have MyChart) OR ?A paper copy in the mail ?If you have any lab test that is abnormal or we need to change your treatment, we will call you to review the results. ? ? ?Testing/Procedures: ?We will order CT coronary calcium score. ?It will cost $99.00 and is not covered by insurance.  ?Please call [phone] to schedule.   ?[facility]  ?[address]  ?[place], [place] [address]  ? ? ?Follow-Up: ?At [facility], you and your health needs are our priority.  As part of our continuing mission to provide you with exceptional heart care, we have created designated Provider Care Teams.  These Care Teams include your primary Cardiologist (physician) and Advanced Practice Providers (APPs -  Physician Assistants and Nurse Practitioners) who all work together to provide you with the care you need, when you need it. ? ?We recommend signing up for the patient portal called "MyChart".  Sign up information is provided on this After Visit Summary.  MyChart is used to connect with patients for Virtual Visits (Telemedicine).  Patients are able to view lab/test results, encounter notes, upcoming appointments, etc.  Non-urgent messages can be sent to your provider as well.   ?To learn more about what you can do with MyChart, go to [email or web address].   ? ?Your next appointment:   ?2 month(s) ? ?The format for your next appointment:   ?In Person ? ?Provider:   ?Jenne Campus, MD  ? ? ?Other  Instructions ?None ? ?

## 2021-06-26 ENCOUNTER — Ambulatory Visit: Payer: 59

## 2021-06-26 LAB — LIPOPROTEIN A (LPA): Lipoprotein (a): 87.5 nmol/L — ABNORMAL HIGH (ref <75.0)

## 2021-07-03 ENCOUNTER — Encounter: Payer: 59 | Admitting: Physical Therapy

## 2021-07-05 ENCOUNTER — Ambulatory Visit (HOSPITAL_COMMUNITY)
Admission: RE | Admit: 2021-07-05 | Discharge: 2021-07-05 | Disposition: A | Discharge status: Home / Self Care | Payer: Self-pay | Source: Ambulatory Visit | Attending: Cardiology | Admitting: Cardiology

## 2021-07-05 ENCOUNTER — Other Ambulatory Visit: Payer: Self-pay

## 2021-07-05 DIAGNOSIS — E782 Mixed hyperlipidemia: Secondary | ICD-10-CM | POA: Insufficient documentation

## 2021-07-05 DIAGNOSIS — R0609 Other forms of dyspnea: Secondary | ICD-10-CM | POA: Insufficient documentation

## 2021-07-05 DIAGNOSIS — R42 Dizziness and giddiness: Secondary | ICD-10-CM | POA: Insufficient documentation

## 2021-07-05 DIAGNOSIS — Z8249 Family history of ischemic heart disease and other diseases of the circulatory system: Secondary | ICD-10-CM | POA: Insufficient documentation

## 2021-07-09 ENCOUNTER — Telehealth: Payer: Self-pay

## 2021-07-09 NOTE — Telephone Encounter (Signed)
Hi Gwen, can you help figure out what happened with this referral to ENT? ?

## 2021-07-10 ENCOUNTER — Ambulatory Visit: Payer: 59

## 2021-07-17 ENCOUNTER — Ambulatory Visit: Payer: 59

## 2021-07-17 ENCOUNTER — Other Ambulatory Visit: Payer: Self-pay

## 2021-07-24 ENCOUNTER — Ambulatory Visit: Payer: 59

## 2021-08-16 ENCOUNTER — Encounter: Payer: Self-pay | Admitting: Family Medicine

## 2021-08-16 DIAGNOSIS — G4700 Insomnia, unspecified: Secondary | ICD-10-CM

## 2021-08-16 MED ORDER — ZOLPIDEM TARTRATE 10 MG PO TABS: ORAL_TABLET | ORAL | 2 refills | Status: DC

## 2021-09-10 ENCOUNTER — Encounter: Payer: Self-pay | Admitting: Cardiology

## 2021-09-11 ENCOUNTER — Ambulatory Visit: Payer: 59 | Admitting: Cardiology

## 2021-09-24 ENCOUNTER — Ambulatory Visit (INDEPENDENT_AMBULATORY_CARE_PROVIDER_SITE_OTHER): Payer: 59 | Admitting: Family Medicine

## 2021-09-24 VITALS — BP 130/86 | Ht 64.0 in | Wt 172.0 lb

## 2021-09-24 DIAGNOSIS — M545 Low back pain, unspecified: Secondary | ICD-10-CM | POA: Diagnosis not present

## 2021-09-24 DIAGNOSIS — M79605 Pain in left leg: Secondary | ICD-10-CM | POA: Diagnosis not present

## 2021-09-24 MED ORDER — PREDNISONE 10 MG PO TABS: ORAL_TABLET | ORAL | 0 refills | Status: DC

## 2021-09-24 NOTE — Patient Instructions (Signed)
You have lumbar radiculopathy (a pinched nerve in your low back). Ok to take tylenol for baseline pain relief (1-2 extra strength tabs 3x/day) Take prednisone dose pack as directed. Day AFTER finishing prednisone you can take aleve or ibuprofen. Stay as active as possible. Physical therapy has been shown to be helpful as well. Strengthening of low back muscles, abdominal musculature are key for long term pain relief. If not improving, will consider further imaging (MRI). Follow up with me in 1 month but let me know how you're doing in 1-2 weeks.

## 2021-09-24 NOTE — Progress Notes (Signed)
sPCP: Copland, Gay Filler, MD  Subjective:   HPI: Patient is a 51 y.o. female here for left lower back pain x2 months. States that it feels like an "electric shock" and radiates down her left groin and lateral left thigh to her knee. Rates it an 8/10 at its worst. Worsened when standing after prolonged sitting or with bending over. She works cleaning houses and is afraid the pain will continue to worsen. She felt similar pains back in 2019 but felt relief after ESI. She notes that previously she felt the pain only in her knee, so this pain is different and she feels it in her left lower back with radiation to left glute and groin. She is using ice and Tylenol with mild relief of the  pain. It does not awaken her from sleep. She denies bowel or bladder incontinence.   MRI lumbar spine 10/2017 which showed left foraminal disc protrusion at L4-L5, slightly displacing the left L4 nerve root.  S/p two ESI in 11/2017 and 02/2018.  Past Medical History:  Diagnosis Date   Breast cancer ([place]) 2011   Left Breast Cancer   Cancer [facility]) 2011   Left Breast Cancer   Hepatitis C    Urinary tract infection     Current Outpatient Medications on File Prior to Visit  Medication Sig Dispense Refill   venlafaxine XR (EFFEXOR XR) 75 MG 24 hr capsule Take 1 capsule (75 mg total) by mouth daily with breakfast. 90 capsule 3   zolpidem (AMBIEN) 10 MG tablet Take 5 mg by mouth at bedtime as needed for insomnia. 15 tablet 2   No current facility-administered medications on file prior to visit.    Past Surgical History:  Procedure Laterality Date   BREAST BIOPSY  2011   CHOLECYSTECTOMY  1991   MASTECTOMY Left 2011   NECK SURGERY  2007   Neck Fusion; Ruptured Disk   NOVASURE ABLATION  11/2014   REDUCTION MAMMAPLASTY Right 2011   TONSILLECTOMY  1988   TUBAL LIGATION      No Known Allergies  BP 130/86   Ht 5' 4"  (1.626 m)   Wt 172 lb (78 kg)   BMI 29.52 kg/m      09/24/2021    9:28 AM  [employer] Adult Exercise  Frequency of aerobic exercise (# of days/week) 0  Average time in minutes 0  Frequency of strengthening activities (# of days/week) 0        View : No data to display.              Objective:  Physical Exam:  Gen: NAD, comfortable in exam room Back Exam:  Inspection: Unremarkable  Palpable tenderness: tenderness to palpation of left PSIS, no midline tenderness Range of Motion:  Flexion 45 deg (able to do so with some discomfort noted); Extension 45 deg; Side Bending to 45 deg bilaterally (some notable discomfort with left side bending); Rotation to 45 deg bilaterally  Leg strength: Quad: 5/5 Hamstring: 5/5  Strength at foot: Plantar-flexion: 5/5 Dorsi-flexion: 5/5 Sensory change: Gross sensation intact to all lumbar and sacral dermatomes.  Reflexes: 2+ at both patellar tendons, 2+ at achilles tendons Gait unremarkable. SLR laying: Negative  XSLR laying: Negative  FABER: positive to left side   Assessment & Plan:  1. Lumbar Radiculopathy MRI with notable L4-L5 disc protrusion back in 2019. Given examination today, question if she also has a disc protrusion of L3. No red flags. Will trial conservative measures and then  can consider repeat imaging if no improvement. Shared decision making to start steroids and do home exercises. Can consider formal PT as well if not improving.

## 2021-09-25 ENCOUNTER — Encounter: Payer: Self-pay | Admitting: Family Medicine

## 2021-10-24 ENCOUNTER — Ambulatory Visit: Payer: 59 | Admitting: Family Medicine

## 2021-10-25 NOTE — Progress Notes (Addendum)
[facility] [address] [place], [place], [place] [address] [phone]  Date:  11/01/2021   Name:  Shirley Holder   DOB:  June 09, 1970   MRN:  [id number]  PCP:  Darreld Mclean, MD    Chief Complaint: Medication Problem (Would like to discuss the Effexor. )   History of Present Illness:  Shirley Holder is a 51 y.o. very pleasant female patient who presents with the following:  Patient seen today for medication follow-up Most recent visit with myself was in January History of osteopenia, hyperlipidemia, breast cancer with left mastectomy 2011, vitamin D deficiency, hypothyroidism, hepatitis C status post curative treatment, insomnia uses Ambien  Following up on Ambien use as well as response to Effexor- she feels like her hot flashes are bad, she is bothered by a low sex drive.  She does not feel like Effexor is really helping her, she would like to try stopping this medication.  We discussed trying a different medication, for the time being she would like to just come off Effexor and see how she does She notes her insomnia is persistent, if she does not use her Ambien she really cannot sleep.  History of hypothyroidism, not currently taking replacement.  Can recheck TSH today if she would like Mammogram- will order for her   She had her endometrial ablation about 7 years ago- all bleeding ceased 2 years ago  She is having typical symptoms of menopause, which we discussed today.  We also discussed some herbal remedies which could be helpful Lab Results  Component Value Date   TSH 1.73 05/14/2021    Patient Active Problem List   Diagnosis Date Noted   Dyspnea on exertion 06/25/2021   Family history of coronary artery disease 06/25/2021   Dizziness 06/25/2021   Greater trochanteric pain syndrome of left lower extremity 01/12/2019   Low back pain radiating to left leg 09/21/2017   History of hepatitis C 02/01/2016    Hyperlipidemia 09/21/2015   History of ductal carcinoma in situ (DCIS) of breast 09/21/2015   Osteopenia 09/21/2015   Vitamin D deficiency 09/21/2015   Hyperthyroidism 09/21/2015   Insomnia 08/14/2015   Anxiety state 08/14/2015    Past Medical History:  Diagnosis Date   Breast cancer ([place]) 2011   Left Breast Cancer   Cancer ([place]) 2011   Left Breast Cancer   Hepatitis C    Urinary tract infection     Past Surgical History:  Procedure Laterality Date   BREAST BIOPSY  2011   CHOLECYSTECTOMY  1991   MASTECTOMY Left 2011   NECK SURGERY  2007   Neck Fusion; Ruptured Disk   NOVASURE ABLATION  11/2014   REDUCTION MAMMAPLASTY Right 2011   TONSILLECTOMY  1988   TUBAL LIGATION      Social History   Tobacco Use   Smoking status: Never   Smokeless tobacco: Never  Substance Use Topics   Alcohol use: Yes    Alcohol/week: 2.0 standard drinks of alcohol    Types: 2 Glasses of wine per week    Comment: Occasional   Drug use: No    Family History  Problem Relation Age of Onset   Heart disease Mother    Heart disease Father    Heart attack Father    Breast cancer Maternal Aunt    Breast cancer Maternal Aunt    Diabetes Neg Hx     No Known Allergies  Medication list  has been reviewed and updated.  Current Outpatient Medications on File Prior to Visit  Medication Sig Dispense Refill   venlafaxine XR (EFFEXOR XR) 75 MG 24 hr capsule Take 1 capsule (75 mg total) by mouth daily with breakfast. 90 capsule 3   No current facility-administered medications on file prior to visit.    Review of Systems:  As per HPI- otherwise negative.   Physical Examination: Vitals:   11/01/21 1401  BP: 138/88  Pulse: 73  Resp: 18  Temp: 97.8 F (36.6 C)  SpO2: 98%   Vitals:   11/01/21 1401  Weight: 176 lb (79.8 kg)  Height: '5\' 4"'$  (1.626 m)   Body mass index is 30.21 kg/m. Ideal Body Weight: Weight in (lb) to have BMI = 25: 145.3  GEN: no acute distress.  Mild obesity, looks  well HEENT: Atraumatic, Normocephalic.  Ears and Nose: No external deformity. CV: RRR, No M/G/R. No JVD. No thrill. No extra heart sounds. PULM: CTA B, no wheezes, crackles, rhonchi. No retractions. No resp. distress. No accessory muscle use. ABD: S, NT, ND No rebound. No HSM. EXTR: No c/c/e PSYCH: Normally interactive. Conversant.    Assessment and Plan: Hyperthyroidism - Plan: TSH  Insomnia, unspecified type - Plan: zolpidem (AMBIEN) 10 MG tablet  Encounter for screening mammogram for malignant neoplasm of breast - Plan: MM 3D SCREEN BREAST UNI RIGHT  Menopausal symptoms  Patient seen today for follow-up She is struggling with likely menopausal symptoms, she had an endometrial ablation 7 years ago so last menstrual period is more difficult to pinpoint.  She would like to taper off Effexor as she does not feel it is really helping.  I asked her to take it every other day for 1 week, then every third day for 3 doses-then stop.  If she feels that her hot flashes are worse or like to try a different medication she will let me know  Refilled Ambien to place on file  Check thyroid  Mammogram ordered Will plan further follow- up pending labs.  Remind patient about shingles vaccine with labs also   Signed Lamar Blinks, MD Addendum 7/14, received TSH.  Message to patient  Results for orders placed or performed in visit on 11/01/21  TSH  Result Value Ref Range   TSH 1.25 0.35 - 5.50 uIU/mL

## 2021-10-26 NOTE — Patient Instructions (Incomplete)
It was good to see you again today!  Assuming all is well, please follow-up in about 6 months I ordered your screening mammogram to be done at your convenience   There are some herbal remedies you might try for menopause sx  lack Cohosh (Actaea racemosa, Cimicifuga racemosa) This herb has received quite a bit of scientific attention for its possible effects on hot flashes. Studies of its effectiveness in reducing hot flashes have produced mixed results. However, some women report that it has helped them. Recent research suggests that black cohosh does not act like estrogen, as once thought. This reduces concerns about its effect on hormone-sensitive tissue (eg, uterus, breast). Black cohosh has had a good safety record over a number of years. There have been reports linking black cohosh to liver problems, and this connection continues to be studied. Red Clover (Trifolium pratense) In five controlled studies, no consistent or conclusive evidence was found that red clover leaf extract reduces hot flashes. As with black cohosh, however, some women claim that red clover has helped them. Studies report few side effects and no serious health problems with use. But studies in animals have raised concerns that red clover might have harmful effects on hormone-sensitive tissue. Dong Quai (Angelica sinensis) Maudry Mayhew has been used in [place] to treat gynecologic conditions for more than 1,200 years. Yet only one randomized clinical study of dong Johnella Moloney has been conducted to determine its effects on hot flashes, and this botanical therapy was not found to be useful in reducing them. Some experts on [place] medicine point out that the preparation studied was not the same as they use in practice. Maudry Mayhew should never be used by women with fibroids or blood-clotting problems such as hemophilia, or by women taking drugs that affect clotting such as warfarin (Coumadin) as bleeding complications can  result. Ginseng (Panax ginseng or Panax quinquefolius) Research has shown that ginseng may help with some menopausal symptoms, such as mood symptoms and sleep disturbances, and with one's overall sense of well-being. However, it has not been found to be helpful for hot flashes. Kava (Piper methysticum) Kava may decrease anxiety, but there is no evidence that it decreases hot flashes. It is important to note that kava has been associated with liver disease. The FDA has issued a warning to patients and providers about kava because of its potential to damage the liver. Because of this concern, [facility] [place] does not allow kava to be sold in [place].

## 2021-11-01 ENCOUNTER — Ambulatory Visit (INDEPENDENT_AMBULATORY_CARE_PROVIDER_SITE_OTHER): Payer: 59 | Admitting: Family Medicine

## 2021-11-01 VITALS — BP 138/88 | HR 73 | Temp 97.8°F | Resp 18 | Ht 64.0 in | Wt 176.0 lb

## 2021-11-01 DIAGNOSIS — N951 Menopausal and female climacteric states: Secondary | ICD-10-CM

## 2021-11-01 DIAGNOSIS — E0590 Thyrotoxicosis, unspecified without thyrotoxic crisis or storm: Secondary | ICD-10-CM

## 2021-11-01 DIAGNOSIS — Z1231 Encounter for screening mammogram for malignant neoplasm of breast: Secondary | ICD-10-CM

## 2021-11-01 DIAGNOSIS — G4700 Insomnia, unspecified: Secondary | ICD-10-CM

## 2021-11-01 MED ORDER — ZOLPIDEM TARTRATE 10 MG PO TABS: ORAL_TABLET | ORAL | 1 refills | Status: DC

## 2021-11-02 ENCOUNTER — Encounter: Payer: Self-pay | Admitting: Family Medicine

## 2021-11-02 LAB — TSH: TSH: 1.25 u[IU]/mL (ref 0.35–5.50)

## 2021-11-06 ENCOUNTER — Telehealth (HOSPITAL_BASED_OUTPATIENT_CLINIC_OR_DEPARTMENT_OTHER): Payer: Self-pay

## 2021-11-20 ENCOUNTER — Other Ambulatory Visit: Payer: Self-pay | Admitting: Family Medicine

## 2021-11-20 ENCOUNTER — Encounter (HOSPITAL_BASED_OUTPATIENT_CLINIC_OR_DEPARTMENT_OTHER): Payer: Self-pay

## 2021-11-20 ENCOUNTER — Ambulatory Visit (HOSPITAL_BASED_OUTPATIENT_CLINIC_OR_DEPARTMENT_OTHER)
Admission: RE | Admit: 2021-11-20 | Discharge: 2021-11-20 | Disposition: A | Discharge status: Home / Self Care | Payer: 59 | Source: Ambulatory Visit | Attending: Family Medicine | Admitting: Family Medicine

## 2021-11-20 DIAGNOSIS — Z1231 Encounter for screening mammogram for malignant neoplasm of breast: Secondary | ICD-10-CM | POA: Insufficient documentation

## 2021-12-24 ENCOUNTER — Encounter (INDEPENDENT_AMBULATORY_CARE_PROVIDER_SITE_OTHER): Payer: 59 | Admitting: Family Medicine

## 2021-12-24 DIAGNOSIS — F411 Generalized anxiety disorder: Secondary | ICD-10-CM

## 2021-12-25 MED ORDER — SERTRALINE HCL 50 MG PO TABS: 50.0000 mg | ORAL_TABLET | Freq: Every day | ORAL | 3 refills | Status: DC

## 2021-12-25 NOTE — Telephone Encounter (Signed)
Please see the MyChart message reply(ies) for my assessment and plan.  The patient gave consent for this Medical Advice Message and is aware that it may result in a bill to their insurance company as well as the possibility that this may result in a co-payment or deductible. They are an established patient, but are not seeking medical advice exclusively about a problem treated during an in person or video visit in the last 7 days. I did not recommend an in person or video visit within 7 days of my reply.  I spent a total of 10 minutes cumulative time within 7 days through MyChart messaging Feliciano Wynter, MD  

## 2022-04-08 ENCOUNTER — Ambulatory Visit (INDEPENDENT_AMBULATORY_CARE_PROVIDER_SITE_OTHER): Payer: 59 | Admitting: Family Medicine

## 2022-04-08 VITALS — BP 120/86 | Ht 64.0 in | Wt 174.0 lb

## 2022-04-08 DIAGNOSIS — M545 Low back pain, unspecified: Secondary | ICD-10-CM | POA: Diagnosis not present

## 2022-04-08 DIAGNOSIS — M79605 Pain in left leg: Secondary | ICD-10-CM

## 2022-04-08 NOTE — Patient Instructions (Signed)
We will set up an epidural steroid injection for your back. Let us know by Thursday morning if you want [place] to send in the steroid dose pack too. Consider physical therapy. Tylenol and/or aleve as needed for pain. Call or message me a week after the injection to let me know how you're doing.

## 2022-04-09 ENCOUNTER — Encounter: Payer: Self-pay | Admitting: Family Medicine

## 2022-04-09 ENCOUNTER — Other Ambulatory Visit: Payer: Self-pay | Admitting: Family Medicine

## 2022-04-09 DIAGNOSIS — M545 Low back pain, unspecified: Secondary | ICD-10-CM

## 2022-04-09 NOTE — Progress Notes (Signed)
PCP: Copland, Gay Filler, MD  Subjective:   HPI: Patient is a 51 y.o. female here for low back pain.  6/5: States that it feels like an "electric shock" and radiates down her left groin and lateral left thigh to her knee. Rates it an 8/10 at its worst. Worsened when standing after prolonged sitting or with bending over. She works cleaning houses and is afraid the pain will continue to worsen. She felt similar pains back in 2019 but felt relief after ESI. She notes that previously she felt the pain only in her knee, so this pain is different and she feels it in her left lower back with radiation to left glute and groin. She is using ice and Tylenol with mild relief of the  pain. It does not awaken her from sleep. She denies bowel or bladder incontinence.   12/18: Patient returns with recurring left sided low back pain for the past 2 months. Radiates from low back into buttock and proximal anterior left thigh. No numbness/tingling. No bowel/bladder dysfunction. No saddle anesthesia. Has tried tylenol, icing. Improved after last visit with prednisone dose pack.  Past Medical History:  Diagnosis Date   Breast cancer ([place]) 2011   Left Breast Cancer   Cancer [facility]) 2011   Left Breast Cancer   Hepatitis C    Urinary tract infection     Current Outpatient Medications on File Prior to Visit  Medication Sig Dispense Refill   sertraline (ZOLOFT) 50 MG tablet Take 1 tablet (50 mg total) by mouth daily. 30 tablet 3   zolpidem (AMBIEN) 10 MG tablet Take 5 mg by mouth at bedtime as needed for insomnia. 45 tablet 1   No current facility-administered medications on file prior to visit.    Past Surgical History:  Procedure Laterality Date   BREAST BIOPSY  2011   CHOLECYSTECTOMY  1991   MASTECTOMY Left 2011   NECK SURGERY  2007   Neck Fusion; Ruptured Disk   NOVASURE ABLATION  11/2014   REDUCTION MAMMAPLASTY Right 2011   TONSILLECTOMY  1988   TUBAL LIGATION      No Known Allergies  BP  120/86   Ht '5\' 4"'$  (1.626 m)   Wt 174 lb (78.9 kg)   BMI 29.87 kg/m      09/24/2021    9:28 AM  [facility]  Frequency of aerobic exercise (# of days/week) 0  Average time in minutes 0  Frequency of strengthening activities (# of days/week) 0        No data to display              Objective:  Physical Exam:  Gen: NAD, comfortable in exam room  Back: No gross deformity, scoliosis. TTP left lumbar paraspinal region.  No midline or bony TTP. FROM with pain extension. Strength LEs 5/5 all muscle groups.   2+ MSRs in patellar and achilles tendons, equal bilaterally. Negative SLRs. Sensation intact to light touch bilaterally.   Assessment & Plan:  1. Low back pain with radiation into left lower extremity - prior MRI showed evidence of L4 radiculopathy.  Discussed options (repeat MRI, ESI, oral prednisone pack with PT).  She would like to try the [facility] - felt when she had this once she had longer relief than the oral prednisone pack.  Call us a week after this to let us know how she's doing.  Otc nsaids, tylenol if needed.

## 2022-04-11 ENCOUNTER — Ambulatory Visit: Payer: 59 | Admitting: Sports Medicine

## 2022-04-13 ENCOUNTER — Other Ambulatory Visit: Payer: Self-pay | Admitting: Family Medicine

## 2022-04-13 DIAGNOSIS — F411 Generalized anxiety disorder: Secondary | ICD-10-CM

## 2022-04-18 ENCOUNTER — Ambulatory Visit
Admission: RE | Admit: 2022-04-18 | Discharge: 2022-04-18 | Disposition: A | Discharge status: Home / Self Care | Payer: 59 | Source: Ambulatory Visit | Attending: Family Medicine | Admitting: Family Medicine

## 2022-04-18 DIAGNOSIS — M79605 Pain in left leg: Secondary | ICD-10-CM

## 2022-04-18 MED ORDER — METHYLPREDNISOLONE ACETATE 40 MG/ML INJ SUSP (RADIOLOG
80.0000 mg | Freq: Once | INTRAMUSCULAR | Status: AC
  Administered 2022-04-18: 80 mg via EPIDURAL

## 2022-04-18 MED ORDER — IOPAMIDOL (ISOVUE-M 200) INJECTION 41%
1.0000 mL | Freq: Once | INTRAMUSCULAR | Status: AC
  Administered 2022-04-18: 1 mL via EPIDURAL

## 2022-04-18 NOTE — Discharge Instructions (Signed)

## 2022-04-21 NOTE — Progress Notes (Unsigned)
[place] at [facility] [address], [place], [place] [address] [phone]  Date:  04/24/2022   Name:  Shirley Holder   DOB:  [birth year]   MRN:  [id number]  PCP:  Darreld Mclean, MD    Chief Complaint: No chief complaint on file.   History of Present Illness:  Shirley Holder is a 51 y.o. very pleasant female patient who presents with the following:  Patient seen today for medication follow-up Most recent visit with myself was in July History of osteopenia, hyperlipidemia, breast cancer with left mastectomy 2011, vitamin D deficiency, hypothyroidism, hepatitis C status post curative treatment, insomnia uses Ambien   Status post endometrial ablation, currently menopausal  She wanted to taper off of Effexor earlier this year-she noted recurrent symptoms of anxiety, we had her start on Zoloft in September  Shingrix Flu shot Recommend COVID booster Pap smear is up-to-date Cologuard up-to-date Mammogram up-to-date  Sertraline Ambien 5 mg most days Lab work was completed in January Patient Active Problem List   Diagnosis Date Noted   Dyspnea on exertion 06/25/2021   Family history of coronary artery disease 06/25/2021   Dizziness 06/25/2021   Greater trochanteric pain syndrome of left lower extremity 01/12/2019   Low back pain radiating to left leg 09/21/2017   History of hepatitis C 02/01/2016   Hyperlipidemia 09/21/2015   History of ductal carcinoma in situ (DCIS) of breast 09/21/2015   Osteopenia 09/21/2015   Vitamin D deficiency 09/21/2015   Hyperthyroidism 09/21/2015   Insomnia 08/14/2015   Anxiety state 08/14/2015    Past Medical History:  Diagnosis Date   Breast cancer ([place]) 2011   Left Breast Cancer   Cancer ([place]) 2011   Left Breast Cancer   Hepatitis C    Urinary tract infection     Past Surgical History:  Procedure Laterality Date   BREAST BIOPSY  2011   CHOLECYSTECTOMY  1991   MASTECTOMY Left  2011   NECK SURGERY  2007   Neck Fusion; Ruptured Disk   NOVASURE ABLATION  11/2014   REDUCTION MAMMAPLASTY Right 2011   TONSILLECTOMY  1988   TUBAL LIGATION      Social History   Tobacco Use   Smoking status: Never   Smokeless tobacco: Never  Substance Use Topics   Alcohol use: Yes    Alcohol/week: 2.0 standard drinks of alcohol    Types: 2 Glasses of wine per week    Comment: Occasional   Drug use: No    Family History  Problem Relation Age of Onset   Heart disease Mother    Heart disease Father    Heart attack Father    Breast cancer Maternal Aunt    Breast cancer Maternal Aunt    Diabetes Neg Hx     No Known Allergies  Medication list has been reviewed and updated.  Current Outpatient Medications on File Prior to Visit  Medication Sig Dispense Refill   sertraline (ZOLOFT) 50 MG tablet Take 1 tablet by mouth once daily 90 tablet 1   zolpidem (AMBIEN) 10 MG tablet Take 5 mg by mouth at bedtime as needed for insomnia. 45 tablet 1   No current facility-administered medications on file prior to visit.    Review of Systems:  As per HPI- otherwise negative.   Physical Examination: There were no vitals filed for this visit. There were no vitals filed for this visit. There is no height or weight on file  to calculate BMI. Ideal Body Weight:    GEN: no acute distress. HEENT: Atraumatic, Normocephalic.  Ears and Nose: No external deformity. CV: RRR, No M/G/R. No JVD. No thrill. No extra heart sounds. PULM: CTA B, no wheezes, crackles, rhonchi. No retractions. No resp. distress. No accessory muscle use. ABD: S, NT, ND, +BS. No rebound. No HSM. EXTR: No c/c/e PSYCH: Normally interactive. Conversant.    Assessment and Plan: ***  Signed Lamar Blinks, MD

## 2022-04-21 NOTE — Patient Instructions (Incomplete)
It was good to see again today, I will be in touch with your labs Consider getting the latest COVID booster if not done so already, also consider the shingles vaccine Please see me in about 6 months assuming all is well

## 2022-04-24 ENCOUNTER — Ambulatory Visit (INDEPENDENT_AMBULATORY_CARE_PROVIDER_SITE_OTHER): Payer: 59 | Admitting: Family Medicine

## 2022-04-24 VITALS — BP 114/60 | HR 72 | Temp 97.8°F | Resp 18 | Ht 64.0 in | Wt 178.8 lb

## 2022-04-24 DIAGNOSIS — G4700 Insomnia, unspecified: Secondary | ICD-10-CM

## 2022-04-24 DIAGNOSIS — F411 Generalized anxiety disorder: Secondary | ICD-10-CM

## 2022-04-24 DIAGNOSIS — Z23 Encounter for immunization: Secondary | ICD-10-CM

## 2022-04-24 DIAGNOSIS — Z0000 Encounter for general adult medical examination without abnormal findings: Secondary | ICD-10-CM

## 2022-04-24 DIAGNOSIS — Z131 Encounter for screening for diabetes mellitus: Secondary | ICD-10-CM | POA: Diagnosis not present

## 2022-04-24 DIAGNOSIS — Z130 Encounter for screening for diseases of the blood and blood-forming organs and certain disorders involving the immune mechanism: Secondary | ICD-10-CM

## 2022-04-24 DIAGNOSIS — R5383 Other fatigue: Secondary | ICD-10-CM

## 2022-04-24 DIAGNOSIS — Z13220 Encounter for screening for lipoid disorders: Secondary | ICD-10-CM

## 2022-04-24 LAB — CBC
HCT: 43.9 % (ref 36.0–46.0)
Hemoglobin: 14.4 g/dL (ref 12.0–15.0)
MCHC: 32.7 g/dL (ref 30.0–36.0)
MCV: 86.2 fl (ref 78.0–100.0)
Platelets: 273 10*3/uL (ref 150.0–400.0)
RBC: 5.09 Mil/uL (ref 3.87–5.11)
RDW: 14.1 % (ref 11.5–15.5)
WBC: 7.9 10*3/uL (ref 4.0–10.5)

## 2022-04-24 LAB — COMPREHENSIVE METABOLIC PANEL
ALT: 16 U/L (ref 0–35)
AST: 15 U/L (ref 0–37)
Albumin: 4.5 g/dL (ref 3.5–5.2)
Alkaline Phosphatase: 54 U/L (ref 39–117)
BUN: 17 mg/dL (ref 6–23)
CO2: 29 mEq/L (ref 19–32)
Calcium: 9.9 mg/dL (ref 8.4–10.5)
Chloride: 102 mEq/L (ref 96–112)
Creatinine, Ser: 0.73 mg/dL (ref 0.40–1.20)
GFR: 94.63 mL/min (ref >60.00)
Glucose, Bld: 79 mg/dL (ref 70–99)
Potassium: 4.3 mEq/L (ref 3.5–5.1)
Sodium: 141 mEq/L (ref 135–145)
Total Bilirubin: 0.5 mg/dL (ref 0.2–1.2)
Total Protein: 7.4 g/dL (ref 6.0–8.3)

## 2022-04-24 LAB — LIPID PANEL
Cholesterol: 259 mg/dL — ABNORMAL HIGH (ref 0–200)
HDL: 78.9 mg/dL (ref >39.00)
LDL Cholesterol: 156 mg/dL — ABNORMAL HIGH (ref 0–99)
NonHDL: 180.4
Total CHOL/HDL Ratio: 3
Triglycerides: 123 mg/dL (ref 0.0–149.0)
VLDL: 24.6 mg/dL (ref 0.0–40.0)

## 2022-04-24 LAB — HEMOGLOBIN A1C: Hgb A1c MFr Bld: 5.5 % (ref 4.6–6.5)

## 2022-04-24 LAB — VITAMIN D 25 HYDROXY (VIT D DEFICIENCY, FRACTURES): VITD: 36.76 ng/mL (ref 30.00–100.00)

## 2022-04-25 ENCOUNTER — Encounter: Payer: Self-pay | Admitting: Family Medicine

## 2022-04-25 DIAGNOSIS — E782 Mixed hyperlipidemia: Secondary | ICD-10-CM

## 2022-04-25 LAB — TSH: TSH: 1.75 u[IU]/mL (ref 0.35–5.50)

## 2022-04-25 MED ORDER — ROSUVASTATIN CALCIUM 10 MG PO TABS: 10.0000 mg | ORAL_TABLET | Freq: Every day | ORAL | 3 refills | Status: DC

## 2022-05-19 ENCOUNTER — Other Ambulatory Visit: Payer: Self-pay | Admitting: Family Medicine

## 2022-05-19 DIAGNOSIS — G4700 Insomnia, unspecified: Secondary | ICD-10-CM

## 2022-06-08 NOTE — Patient Instructions (Incomplete)
Good to see you again today  

## 2022-06-08 NOTE — Progress Notes (Deleted)
[place] at [facility] [address], [place], [place] [address] [phone] [phone] [phone]  Date:  06/12/2022   Name:  Shirley Holder   DOB:  August 15, 1970   MRN:  [id number]  PCP:  Darreld Mclean, MD    Chief Complaint: No chief complaint on file.   History of Present Illness:  Shirley Holder is a 52 y.o. very pleasant female patient who presents with the following:  Pt seen today for concern of neck and shoulder pain Last seen by myself last month  History of osteopenia, hyperlipidemia, breast cancer with left mastectomy 2011, vitamin D deficiency, hypothyroidism, hepatitis C status post curative treatment, insomnia uses Ambien    At our last visit she was doing well with zoloft She also uses ambien most days   Shingrix   Patient Active Problem List   Diagnosis Date Noted   Dyspnea on exertion 06/25/2021   Family history of coronary artery disease 06/25/2021   Dizziness 06/25/2021   Greater trochanteric pain syndrome of left lower extremity 01/12/2019   Low back pain radiating to left leg 09/21/2017   History of hepatitis C 02/01/2016   Hyperlipidemia 09/21/2015   History of ductal carcinoma in situ (DCIS) of breast 09/21/2015   Osteopenia 09/21/2015   Vitamin D deficiency 09/21/2015   Hyperthyroidism 09/21/2015   Insomnia 08/14/2015   Anxiety state 08/14/2015    Past Medical History:  Diagnosis Date   Breast cancer ([place]) 2011   Left Breast Cancer   Cancer ([place]) 2011   Left Breast Cancer   Hepatitis C    Urinary tract infection     Past Surgical History:  Procedure Laterality Date   BREAST BIOPSY  2011   CHOLECYSTECTOMY  1991   MASTECTOMY Left 2011   NECK SURGERY  2007   Neck Fusion; Ruptured Disk   NOVASURE ABLATION  11/2014   REDUCTION MAMMAPLASTY Right 2011   TONSILLECTOMY  1988   TUBAL LIGATION      Social History   Tobacco Use   Smoking status: Never   Smokeless tobacco: Never  Substance Use Topics    Alcohol use: Yes    Alcohol/week: 2.0 standard drinks of alcohol    Types: 2 Glasses of wine per week    Comment: Occasional   Drug use: No    Family History  Problem Relation Age of Onset   Heart disease Mother    Heart disease Father    Heart attack Father    Breast cancer Maternal Aunt    Breast cancer Maternal Aunt    Diabetes Neg Hx     No Known Allergies  Medication list has been reviewed and updated.  Current Outpatient Medications on File Prior to Visit  Medication Sig Dispense Refill   rosuvastatin (CRESTOR) 10 MG tablet Take 1 tablet (10 mg total) by mouth daily. 90 tablet 3   sertraline (ZOLOFT) 50 MG tablet Take 1 tablet by mouth once daily 90 tablet 1   zolpidem (AMBIEN) 10 MG tablet TAKE 1/2 TABLET BY MOUTH DAILY AS NEEDED FOR INSOMNIA 15 tablet 3   No current facility-administered medications on file prior to visit.    Review of Systems:  As per HPI- otherwise negative.   Physical Examination: There were no vitals filed for this visit. There were no vitals filed for this visit. There is no height or weight on file to calculate BMI. Ideal Body Weight:    GEN: no acute distress. HEENT: Atraumatic,  Normocephalic.  Ears and Nose: No external deformity. CV: RRR, No M/G/R. No JVD. No thrill. No extra heart sounds. PULM: CTA B, no wheezes, crackles, rhonchi. No retractions. No resp. distress. No accessory muscle use. ABD: S, NT, ND, +BS. No rebound. No HSM. EXTR: No c/c/e PSYCH: Normally interactive. Conversant.    Assessment and Plan: ***  Signed Lamar Blinks, MD

## 2022-06-10 ENCOUNTER — Ambulatory Visit (INDEPENDENT_AMBULATORY_CARE_PROVIDER_SITE_OTHER): Payer: 59 | Admitting: Family Medicine

## 2022-06-10 VITALS — BP 130/88 | Ht 64.0 in | Wt 175.0 lb

## 2022-06-10 DIAGNOSIS — M7541 Impingement syndrome of right shoulder: Secondary | ICD-10-CM

## 2022-06-10 MED ORDER — DICLOFENAC SODIUM 75 MG PO TBEC: 75.0000 mg | DELAYED_RELEASE_TABLET | Freq: Two times a day (BID) | ORAL | 1 refills | Status: AC

## 2022-06-10 NOTE — Progress Notes (Signed)
  Shirley Holder - 52 y.o. female MRN [id number]  Date of birth: 22-Sep-1970  SUBJECTIVE:  Including CC & ROS.  CC: Right shoulder pain  HPI: Patient presents for evaluation of lateral right shoulder pain with occasional finger numbness and grip weakness. This has been worsening over the past two months. Described as pulling sensation especially when abducting arms, reaching across or overhead. Also has a "crick" in her neck that starts at the side of her head and down to her shoulder. No nocturnal pain. Finger numbness is at her four fingertips, none around her hand or thumb or in arm. Has been taking ibuprofen 428m every 6 hours.    HISTORY: Past Medical, Surgical, Social, and Family History Reviewed & Updated per EMR.   Pertinent Historical Findings include: Cervical spinal fusion 2007  DATA REVIEWED: MRI 01/2016: Status post spinal fusion C6-C7. DDD C5-C6.  PHYSICAL EXAM:  VS: BP:130/88  HR: bpm  TEMP: ( )  RESP:   HT:5' 4"$  (162.6 cm)   WT:175 lb (79.4 kg)  BMI:30.02  PHYSICAL EXAM: R Shoulder: Inspection: no obvious deformity, atrophy, or asymmetry. No bruising or swelling Palpation: no TTP over [facility] Hospital[facility] or bicipital groove.  ROM: R shoulder abduction limited to 90 deg actively.  Full passive ROM. Full ROM internal/external rotation Neuro: NV intact distally Special Tests:  - Impingement: Positive Hawkins, Neers, empty can, painful arc sign. - Supraspinatus: Positive empty can, negative drop arm. 5/5 strength with resisted flexion at 20 degrees - Infraspinatus/Teres Minor: 5/5 strength with ER - Subscapularis: negative belly press, negative bear hug. 5/5 strength with IR - Biceps tendon: Negative Speeds, Yerrgason's  - Labrum: Negative Obriens, negative clunk, good stability  ASSESSMENT & PLAN: See problem based charting & AVS for pt instructions.  R shoulder impingement - physical exam most consistent with impingement with no rotator cuff weakness. Numbness in fingers not  consistent with particular nerve pathology. Symptoms are unlikely to be associated with history of spinal fusion or new radiculopathy. Trapezius/neck pain likely from compensation from weakness/pain of shoulder.  - Formal PT given severity of symptoms and patient preference - Diclofenac BID - Follow up in 1 month. If no improvement, can consider subacromial injection for pain relief or further evaluation with imaging.   JEstevan Oaks MSan Acacioof Medicine

## 2022-06-10 NOTE — Patient Instructions (Addendum)
You have rotator cuff impingement Try to avoid painful activities (overhead activities, lifting with extended arm) as much as possible. Diclofenac 73m twice a day with food for pain and inflammation. Can take tylenol in addition to this. Subacromial injection may be beneficial to help with pain and to decrease inflammation. Start physical therapy with transition to home exercise program. Do home exercise program with theraband and scapular stabilization exercises daily 3 sets of 10 once a day. If not improving at follow-up we will consider imaging, injection, and/or nitro patches. Follow up with me in 1 month but call me sooner if you're struggling and we would do an injection.   [id number] Shirley JMartiniquePlace [place]Whale Pass [place] [phone]: [phone]

## 2022-06-12 ENCOUNTER — Ambulatory Visit: Payer: 59 | Admitting: Family Medicine

## 2022-07-18 ENCOUNTER — Encounter: Payer: Self-pay | Admitting: Family Medicine

## 2022-07-18 DIAGNOSIS — E782 Mixed hyperlipidemia: Secondary | ICD-10-CM

## 2022-08-05 ENCOUNTER — Encounter: Payer: Self-pay | Admitting: *Deleted

## 2022-09-17 NOTE — Progress Notes (Addendum)
[name] at [facility] [address] [place], [place] [address] [phone]      Date:  09/19/2022   Name:  Shirley Holder   DOB:  Jul 12, 1970   MRN:  [id number]  PCP:  Pearline Cables, MD    Chief Complaint: medication refill, labs (Concerns/ questions: none/Shingrix: none yet)   History of Present Illness:  Shirley Holder is a 52 y.o. very pleasant female patient who presents with the following:  Pt seen today for medication refills Last visit with myself was in January  History of osteopenia, hyperlipidemia, breast cancer with left mastectomy 2011, vitamin D deficiency, hypothyroidism, hepatitis C status post curative treatment, insomnia uses Ambien    Status post endometrial ablation, currently menopausal  Crestor-she decreased use to twice a week this spring due to potential side effects/body aches She is tolerating twice a week ok Zoloft Ambien  Shingrix-she would be willing to start today Pap can be updated later this summer; offered to do today, she declines for now Cologuard also due in July   Labs are on file from January    Patient Active Problem List   Diagnosis Date Noted   Dyspnea on exertion 06/25/2021   Family history of coronary artery disease 06/25/2021   Dizziness 06/25/2021   Greater trochanteric pain syndrome of left lower extremity 01/12/2019   Low back pain radiating to left leg 09/21/2017   History of hepatitis C 02/01/2016   Hyperlipidemia 09/21/2015   History of ductal carcinoma in situ (DCIS) of breast 09/21/2015   Osteopenia 09/21/2015   Vitamin D deficiency 09/21/2015   Hyperthyroidism 09/21/2015   Insomnia 08/14/2015   Anxiety state 08/14/2015    Past Medical History:  Diagnosis Date   Breast cancer ([facility]) 2011   Left Breast Cancer   Cancer ([facility]) 2011   Left Breast Cancer   Hepatitis C    Urinary tract infection     Past Surgical History:  Procedure Laterality Date   BREAST  BIOPSY  2011   CHOLECYSTECTOMY  1991   MASTECTOMY Left 2011   NECK SURGERY  2007   Neck Fusion; Ruptured Disk   NOVASURE ABLATION  11/2014   REDUCTION MAMMAPLASTY Right 2011   TONSILLECTOMY  1988   TUBAL LIGATION      Social History   Tobacco Use   Smoking status: Never   Smokeless tobacco: Never  Substance Use Topics   Alcohol use: Yes    Alcohol/week: 2.0 standard drinks of alcohol    Types: 2 Glasses of wine per week    Comment: Occasional   Drug use: No    Family History  Problem Relation Age of Onset   Heart disease Mother    Heart disease Father    Heart attack Father    Breast cancer Maternal Aunt    Breast cancer Maternal Aunt    Diabetes Neg Hx     No Known Allergies  Medication list has been reviewed and updated.  Current Outpatient Medications on File Prior to Visit  Medication Sig Dispense Refill   diclofenac (VOLTAREN) 75 MG EC tablet Take 1 tablet (75 mg total) by mouth 2 (two) times daily. 60 tablet 1   rosuvastatin (CRESTOR) 10 MG tablet Take 1 tablet (10 mg total) by mouth 2 (two) times a week.     sertraline (ZOLOFT) 50 MG tablet Take 1 tablet by mouth once daily 90 tablet 1   zolpidem (AMBIEN) 10 MG tablet  TAKE 1/2 TABLET BY MOUTH DAILY AS NEEDED FOR INSOMNIA 15 tablet 3   No current facility-administered medications on file prior to visit.    Review of Systems:  As per HPI- otherwise negative.  Physical Examination: Vitals:   09/19/22 1539  BP: 124/78  Pulse: 79  Resp: 18  Temp: 97.9 F (36.6 C)  SpO2: 99%   Vitals:   09/19/22 1539  Weight: 169 lb 12.8 oz (77 kg)  Height: 5\' 4"  (1.626 m)   Body mass index is 29.15 kg/m. Ideal Body Weight: Weight in (lb) to have BMI = 25: 145.3  GEN: no acute distress.  Overweight, looks well HEENT: Atraumatic, Normocephalic.  Ears and Nose: No external deformity. CV: RRR, No M/G/R. No JVD. No thrill. No extra heart sounds. PULM: CTA B, no wheezes, crackles, rhonchi. No retractions. No resp.  distress. No accessory muscle use. ABD: S, NT, ND EXTR: No c/c/e PSYCH: Normally interactive. Conversant.  Patient has noted plantar fasciitis type pain.  She has tenderness at the left plantar fascia insertion of the calcaneus consistent with plantar fasciitis   Assessment and Plan: Mixed hyperlipidemia - Plan: Comprehensive metabolic panel, Lipid panel  Anxiety state - Plan: sertraline (ZOLOFT) 50 MG tablet  Insomnia, unspecified type - Plan: zolpidem (AMBIEN) 10 MG tablet  Immunization due - Plan: Zoster Recombinant (Shingrix )  Patient seen today for follow-up.  She started on Crestor earlier this year, is taking twice weekly due to side effects.  She tolerates this reduced schedule okay.  Will check on her lab work today  Sertraline is working well for her, refilled She takes zolpidem 5 mg chronically for insomnia, refilled today  Gave first dose of Shingrix, plan for second dose in 2 to 6 months  Discussed conservative care for plantar fasciitis   Signed Abbe Amsterdam, MD  Received labs as below, message to pt  Results for orders placed or performed in visit on 09/19/22  Comprehensive metabolic panel  Result Value Ref Range   Sodium 142 135 - 145 mEq/L   Potassium 4.1 3.5 - 5.1 mEq/L   Chloride 105 96 - 112 mEq/L   CO2 31 19 - 32 mEq/L   Glucose, Bld 80 70 - 99 mg/dL   BUN 11 6 - 23 mg/dL   Creatinine, Ser 1.61 0.40 - 1.20 mg/dL   Total Bilirubin 0.5 0.2 - 1.2 mg/dL   Alkaline Phosphatase 44 39 - 117 U/L   AST 15 0 - 37 U/L   ALT 12 0 - 35 U/L   Total Protein 7.4 6.0 - 8.3 g/dL   Albumin 4.3 3.5 - 5.2 g/dL   GFR 09.60 >45.40 mL/min   Calcium 9.5 8.4 - 10.5 mg/dL  Lipid panel  Result Value Ref Range   Cholesterol 164 0 - 200 mg/dL   Triglycerides 981.1 0.0 - 149.0 mg/dL   HDL 91.47 >82.95 mg/dL   VLDL 62.1 0.0 - 30.8 mg/dL   LDL Cholesterol 79 0 - 99 mg/dL   Total CHOL/HDL Ratio 3    NonHDL 101.20

## 2022-09-19 ENCOUNTER — Ambulatory Visit (INDEPENDENT_AMBULATORY_CARE_PROVIDER_SITE_OTHER): Payer: 59 | Admitting: Family Medicine

## 2022-09-19 ENCOUNTER — Encounter: Payer: Self-pay | Admitting: Family Medicine

## 2022-09-19 VITALS — BP 124/78 | HR 79 | Temp 97.9°F | Resp 18 | Ht 64.0 in | Wt 169.8 lb

## 2022-09-19 DIAGNOSIS — E782 Mixed hyperlipidemia: Secondary | ICD-10-CM | POA: Diagnosis not present

## 2022-09-19 DIAGNOSIS — F411 Generalized anxiety disorder: Secondary | ICD-10-CM | POA: Diagnosis not present

## 2022-09-19 DIAGNOSIS — G4700 Insomnia, unspecified: Secondary | ICD-10-CM | POA: Diagnosis not present

## 2022-09-19 DIAGNOSIS — Z23 Encounter for immunization: Secondary | ICD-10-CM

## 2022-09-19 MED ORDER — ZOLPIDEM TARTRATE 10 MG PO TABS
ORAL_TABLET | ORAL | 5 refills | Status: DC
Start: 2022-09-19 — End: 2023-03-18

## 2022-09-19 MED ORDER — SERTRALINE HCL 50 MG PO TABS: 50.0000 mg | ORAL_TABLET | Freq: Every day | ORAL | 3 refills | Status: DC

## 2022-09-19 NOTE — Patient Instructions (Signed)
Good to see you today!  I will be in touch with your labs We will check on how your cholesterol is responding 1st dose of shingrix today- 2nd dose in 2-6 months Please see me in 6 months

## 2022-09-20 ENCOUNTER — Encounter: Payer: Self-pay | Admitting: Family Medicine

## 2022-09-20 LAB — COMPREHENSIVE METABOLIC PANEL
ALT: 12 U/L (ref 0–35)
AST: 15 U/L (ref 0–37)
Albumin: 4.3 g/dL (ref 3.5–5.2)
Alkaline Phosphatase: 44 U/L (ref 39–117)
BUN: 11 mg/dL (ref 6–23)
CO2: 31 mEq/L (ref 19–32)
Calcium: 9.5 mg/dL (ref 8.4–10.5)
Chloride: 105 mEq/L (ref 96–112)
Creatinine, Ser: 0.8 mg/dL (ref 0.40–1.20)
GFR: 84.54 mL/min (ref >60.00)
Glucose, Bld: 80 mg/dL (ref 70–99)
Potassium: 4.1 mEq/L (ref 3.5–5.1)
Sodium: 142 mEq/L (ref 135–145)
Total Bilirubin: 0.5 mg/dL (ref 0.2–1.2)
Total Protein: 7.4 g/dL (ref 6.0–8.3)

## 2022-09-20 LAB — LIPID PANEL
Cholesterol: 164 mg/dL (ref 0–200)
HDL: 62.4 mg/dL (ref >39.00)
LDL Cholesterol: 79 mg/dL (ref 0–99)
NonHDL: 101.2
Total CHOL/HDL Ratio: 3
Triglycerides: 109 mg/dL (ref 0.0–149.0)
VLDL: 21.8 mg/dL (ref 0.0–40.0)

## 2022-11-13 ENCOUNTER — Other Ambulatory Visit: Payer: Self-pay

## 2022-11-13 ENCOUNTER — Ambulatory Visit
Admission: RE | Admit: 2022-11-13 | Discharge: 2022-11-13 | Disposition: A | Discharge status: Home / Self Care | Payer: 59 | Source: Ambulatory Visit | Attending: Family Medicine | Admitting: Family Medicine

## 2022-11-13 VITALS — BP 139/85 | HR 72 | Temp 98.7°F

## 2022-11-13 DIAGNOSIS — G629 Polyneuropathy, unspecified: Secondary | ICD-10-CM | POA: Diagnosis not present

## 2022-11-13 MED ORDER — GABAPENTIN 300 MG PO CAPS: 300.0000 mg | ORAL_CAPSULE | Freq: Every day | ORAL | 0 refills | Status: DC

## 2022-11-13 MED ORDER — METHYLPREDNISOLONE SODIUM SUCC 40 MG IJ SOLR
80.0000 mg | Freq: Once | INTRAMUSCULAR | Status: AC
  Administered 2022-11-13: 80 mg via INTRAMUSCULAR

## 2022-11-13 NOTE — Discharge Instructions (Signed)
You have been given a shot of steroid Take the gabapentin at bedtime.  May take up to 3 times a day as needed for nerve pain.  It may cause drowsiness Check MyChart tomorrow for your lab test results Call your doctors office tomorrow for a follow-up appointment.  I will notify her of your condition

## 2022-11-13 NOTE — ED Provider Notes (Signed)
[facility]    CSN: [id number] Arrival date & time: 11/13/22  1759      History   Chief Complaint Chief Complaint  Patient presents with   Generalized Body Aches    Right side body pain - Entered by patient    HPI Shirley Holder is a 52 y.o. female.   Patient states she had an absolutely normal day Saturday.  Woke up Sunday with numbness and tingling in her right leg states it is a feeling when she touches her skin it is prickly and it hurts.  It extends from the waistline down on the right side only to the foot.  The skin is hypersensitive and painful, even to wear clothing. A day later she started developing a similar hypersensitivity feeling on the right side of her scalp and her ear.  It hurts to touch. She has no weakness.  She has no difficulty with vision or hearing, thought or dexterity.  She does have a history of degenerative disc disease in her cervical spine..    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      Home Medications    Prior to Admission medications   Medication Sig Start Date End Date Taking? Authorizing Provider  gabapentin (NEURONTIN) 300 MG capsule Take 1 capsule (300 mg total) by mouth at bedtime. 11/13/22  Yes Eustace Moore, MD  diclofenac (VOLTAREN) 75 MG EC tablet Take 1 tablet (75 mg total) by mouth 2 (two) times daily. 06/10/22   Hudnall, Azucena Fallen, MD  rosuvastatin (CRESTOR) 10 MG tablet Take 1 tablet (10 mg total) by mouth 2 (two) times a week. 07/18/22   Eulis Foster, FNP  sertraline (ZOLOFT) 50 MG tablet Take 1 tablet (50 mg total) by mouth daily. 09/19/22   Copland, Gwenlyn Found, MD  zolpidem (AMBIEN) 10 MG tablet TAKE 1/2 TABLET BY MOUTH DAILY AS NEEDED FOR INSOMNIA 09/19/22   Copland, Gwenlyn Found, MD    Family History Family History  Problem Relation Age of Onset   Heart disease Mother    Heart disease Father    Heart attack Father    Breast cancer Maternal Aunt    Breast cancer Maternal Aunt    Diabetes Neg Hx     Social History Social History   Tobacco Use  Smoking status: Never   Smokeless tobacco: Never  Substance Use Topics   Alcohol use: Yes    Alcohol/week: 2.0 standard drinks of alcohol    Types: 2 Glasses of wine per week    Comment: Occasional   Drug use: No     Allergies   Patient has no known allergies.   Review of Systems Review of Systems See HPI  Physical Exam Triage Vital Signs ED Triage Vitals  Encounter Vitals Group     BP 11/13/22 1806 139/85     Systolic BP Percentile --      Diastolic BP Percentile --      Pulse Rate 11/13/22 1806 72     Resp --      Temp 11/13/22 1806 98.7 F (37.1 C)     Temp Source 11/13/22 1806 Oral     SpO2 11/13/22 1806 97 %     Weight --      Height --      Head Circumference --      Peak Flow --      Pain Score 11/13/22 1807 8     Pain Loc --      Pain Education --      Exclude from Growth Chart --    No data  found.  Updated Vital Signs BP 139/85 (BP Location: Right Arm)   Pulse 72   Temp 98.7 F (37.1 C) (Oral)   SpO2 97%      Physical Exam Constitutional:      General: She is not in acute distress.    Appearance: She is well-developed.     Comments: Anxious about health.  Overweight  HENT:     Head: Normocephalic and atraumatic.   Eyes:     Conjunctiva/sclera: Conjunctivae normal.     Pupils: Pupils are equal, round, and reactive to light.  Cardiovascular:     Rate and Rhythm: Normal rate and regular rhythm.     Heart sounds: Normal heart sounds.  Pulmonary:     Effort: Pulmonary effort is normal. No respiratory distress.     Breath sounds: Normal breath sounds.  Abdominal:     General: There is no distension.     Palpations: Abdomen is soft.  Musculoskeletal:        General: Tenderness present. No swelling. Normal range of motion.     Cervical back: Normal range of motion and neck supple.  Skin:    General: Skin is warm and dry.       Neurological:     Mental Status: She is alert.     Motor: Motor function is intact.     Coordination: Coordination is intact.     Gait: Gait is intact.      UC Treatments / Results  Labs (all labs ordered are listed, but only abnormal results are displayed) Labs Reviewed  TSH  COMPREHENSIVE METABOLIC PANEL  CBC WITH DIFFERENTIAL/PLATELET  VITAMIN B12    EKG   Radiology No results found.  Procedures Procedures (including critical care time)  Medications Ordered in UC Medications  methylPREDNISolone sodium succinate (SOLU-MEDROL) 40 mg/mL injection 80 mg (has no administration in time range)    Initial Impression / Assessment and Plan / UC Course  I have reviewed the triage vital signs and the nursing notes.  Pertinent labs & imaging results that were available during my care of the patient were reviewed by me and considered in my medical decision making (see chart for details).  Unusual symptoms.  Numbness has  not changed with neck range of motion or Spurling's maneuver, with straight leg raise or physical exam challenges to spine. Doubt a nutritional or diabetic neuropathy given unilateral side Patient feels it may be shingles but it is more extensive than shingles normally presents  Final Clinical Impressions(s) / UC Diagnoses   Final diagnoses:  Neuropathy     Discharge Instructions      You have been given a shot of steroid Take the gabapentin at bedtime.  May take up to 3 times a day as needed for nerve pain.  It may cause drowsiness Check MyChart tomorrow for your lab test results Call your doctors office tomorrow for a follow-up appointment.  I will notify her of your condition   ED Prescriptions     Medication Sig Dispense Auth. Provider   gabapentin (NEURONTIN) 300 MG capsule Take 1 capsule (300 mg total) by mouth at bedtime. 30 capsule Eustace Moore, MD      PDMP not reviewed this encounter.   Eustace Moore, MD 11/13/22 [phone]

## 2022-11-13 NOTE — ED Triage Notes (Signed)
C/o pain to right leg and right side of head since Sunday upon waking. Denies pain to right arm.

## 2022-11-18 ENCOUNTER — Encounter: Payer: Self-pay | Admitting: Family Medicine

## 2022-11-18 NOTE — Progress Notes (Unsigned)
Shirley [facility] at [facility] [address] [place], [place] [address] [phone]  Date:  11/20/2022   Name:  Shirley Holder   DOB:  [birth year]   MRN:  [id number]  PCP:  Shirley Cables, MD    Chief Complaint: No chief complaint on file.   History of Present Illness:  Shirley Holder is a 52 y.o. very pleasant female patient who presents with the following:  Pt seen today with concern of side pain Last seen by myself 5/30 for routine visit-  History of osteopenia, hyperlipidemia, breast cancer with left mastectomy 2011, vitamin D deficiency, hypothyroidism, hepatitis C status post curative treatment, insomnia uses Ambien  Status post endometrial ablation, currently menopausal  More recently, she was seen at urgent care on 7/24 with a mysterious numbness and tingling on her right side  Unusual symptoms.  Numbness has not changed with neck range of motion or Spurling's maneuver, with straight leg raise or physical exam challenges to spine. Doubt a nutritional or diabetic neuropathy given unilateral side Patient feels it may be shingles but it is more extensive than shingles normally presents   Final Clinical Impressions(s) / UC Diagnoses    Final diagnoses:  Neuropathy    You have been given a shot of steroid Take the gabapentin at bedtime.  May take up to 3 times a day as needed for nerve pain.  It may cause drowsiness Check MyChart tomorrow for your lab test results Call your doctors office tomorrow for a follow-up appointment.  I will notify her of your condition Patient Active Problem List   Diagnosis Date Noted   Dyspnea on exertion 06/25/2021   Family history of coronary artery disease 06/25/2021   Dizziness 06/25/2021   Greater trochanteric pain syndrome of left lower extremity 01/12/2019   Low back pain radiating to left leg 09/21/2017   History of hepatitis C 02/01/2016   Hyperlipidemia 09/21/2015   History of ductal  carcinoma in situ (DCIS) of breast 09/21/2015   Osteopenia 09/21/2015   Vitamin D deficiency 09/21/2015   Hyperthyroidism 09/21/2015   Insomnia 08/14/2015   Anxiety state 08/14/2015    Past Medical History:  Diagnosis Date   Breast cancer (HCC) 2011   Left Breast Cancer   Cancer (HCC) 2011   Left Breast Cancer   Hepatitis C    Urinary tract infection     Past Surgical History:  Procedure Laterality Date   BREAST BIOPSY  2011   CHOLECYSTECTOMY  1991   MASTECTOMY Left 2011   NECK SURGERY  2007   Neck Fusion; Ruptured Disk   NOVASURE ABLATION  11/2014   REDUCTION MAMMAPLASTY Right 2011   TONSILLECTOMY  1988   TUBAL LIGATION      Social History   Tobacco Use   Smoking status: Never   Smokeless tobacco: Never  Substance Use Topics   Alcohol use: Yes    Alcohol/week: 2.0 standard drinks of alcohol    Types: 2 Glasses of wine per week    Comment: Occasional   Drug use: No    Family History  Problem Relation Age of Onset   Heart disease Mother    Heart disease Father    Heart attack Father    Breast cancer Maternal Aunt    Breast cancer Maternal Aunt    Diabetes Neg Hx     No Known Allergies  Medication list has been reviewed and updated.  Current Outpatient Medications on File Prior  to Visit  Medication Sig Dispense Refill   diclofenac (VOLTAREN) 75 MG EC tablet Take 1 tablet (75 mg total) by mouth 2 (two) times daily. 60 tablet 1   gabapentin (NEURONTIN) 300 MG capsule Take 1 capsule (300 mg total) by mouth at bedtime. 30 capsule 0   rosuvastatin (CRESTOR) 10 MG tablet Take 1 tablet (10 mg total) by mouth 2 (two) times a week.     sertraline (ZOLOFT) 50 MG tablet Take 1 tablet (50 mg total) by mouth daily. 90 tablet 3   zolpidem (AMBIEN) 10 MG tablet TAKE 1/2 TABLET BY MOUTH DAILY AS NEEDED FOR INSOMNIA 15 tablet 5   No current facility-administered medications on file prior to visit.    Review of Systems:  As per HPI- otherwise negative.   Physical  Examination: There were no vitals filed for this visit. There were no vitals filed for this visit. There is no height or weight on file to calculate BMI. Ideal Body Weight:    GEN: no acute distress. HEENT: Atraumatic, Normocephalic.  Ears and Nose: No external deformity. CV: RRR, No M/G/R. No JVD. No thrill. No extra heart sounds. PULM: CTA B, no wheezes, crackles, rhonchi. No retractions. No resp. distress. No accessory muscle use. ABD: S, NT, ND, +BS. No rebound. No HSM. EXTR: No c/c/e PSYCH: Normally interactive. Conversant.    Assessment and Plan: ***  Signed Abbe Amsterdam, MD

## 2022-11-20 ENCOUNTER — Ambulatory Visit (INDEPENDENT_AMBULATORY_CARE_PROVIDER_SITE_OTHER): Payer: 59 | Admitting: Family Medicine

## 2022-11-20 VITALS — BP 122/70 | HR 76 | Temp 97.8°F | Resp 18 | Ht 64.0 in | Wt 163.6 lb

## 2022-11-20 DIAGNOSIS — R200 Anesthesia of skin: Secondary | ICD-10-CM

## 2022-11-20 DIAGNOSIS — R202 Paresthesia of skin: Secondary | ICD-10-CM | POA: Diagnosis not present

## 2022-11-20 DIAGNOSIS — Z1211 Encounter for screening for malignant neoplasm of colon: Secondary | ICD-10-CM

## 2022-11-20 MED ORDER — PREDNISONE 20 MG PO TABS: ORAL_TABLET | ORAL | 0 refills | Status: DC

## 2022-11-20 NOTE — Patient Instructions (Signed)
It was good to see you today- I am sorry you are dealing with these mysterious symptoms- however, I am certainly hopeful that this will go away!  You could stop taking gabapentin if you wish.  Lets have you try the prednisone for 6 days, let me know if this seems to help you.  Please keep me posted about how you are doing   I will get the Cologuard kit sent to your home for update

## 2022-12-08 ENCOUNTER — Encounter: Payer: Self-pay | Admitting: Family Medicine

## 2023-02-26 ENCOUNTER — Encounter: Payer: Self-pay | Admitting: Family Medicine

## 2023-03-18 ENCOUNTER — Other Ambulatory Visit: Payer: Self-pay | Admitting: Family Medicine

## 2023-03-18 DIAGNOSIS — G4700 Insomnia, unspecified: Secondary | ICD-10-CM

## 2023-03-19 ENCOUNTER — Encounter: Payer: Self-pay | Admitting: Family Medicine

## 2023-03-28 ENCOUNTER — Encounter: Payer: Self-pay | Admitting: Family Medicine

## 2023-03-28 DIAGNOSIS — F411 Generalized anxiety disorder: Secondary | ICD-10-CM

## 2023-04-14 MED ORDER — SERTRALINE HCL 100 MG PO TABS: 100.0000 mg | ORAL_TABLET | Freq: Every day | ORAL | 3 refills | Status: DC

## 2023-04-14 NOTE — Addendum Note (Signed)
Addended by: Abbe Amsterdam C on: 04/14/2023 10:52 AM   Modules accepted: Orders

## 2023-05-20 NOTE — Progress Notes (Unsigned)
[facility] at [facility] [address] [place], [place] [address] [phone]  Date:  05/21/2023   Name:  Shirley Holder   DOB:  [birth year]   MRN:  [id number]  PCP:  Pearline Cables, MD    Chief Complaint: sores on the hand (R hand that wont heal. /Flu shot today: yes)   History of Present Illness:  Shirley Holder is a 53 y.o. very pleasant female patient who presents with the following:  Patient seen today with concern of sores on her right hand Most recent visit with myself was in July History of osteopenia, hyperlipidemia, breast cancer with left mastectomy 2011, vitamin D deficiency, hypothyroidism, hepatitis C status post curative treatment, insomnia uses Ambien  Status post endometrial ablation, currently menopausal  Mammogram- she will schedule Flu vaccine- givetoday  Second of Shingrix  She has two sores on the back of her right hand that have been present for 2-3 months Started out as a little dry spot but keeps bothering her  and are not healing They are sore   She does not have a derm right now-her previous dermatologist is no longer practicing  She has a couple other areas of minor skin irritation on her skin but I do not think they are the same as what she has on her hands. 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/2015   Osteopenia 09/21/2015   Vitamin D deficiency 09/21/2015   Hyperthyroidism 09/21/2015   Insomnia 08/14/2015   Anxiety state 08/14/2015    Past Medical History:  Diagnosis Date   Breast cancer  (HCC) 2011   Left Breast Cancer   Cancer (HCC) 2011   Left Breast Cancer   Hepatitis C    Urinary tract infection     Past Surgical History:  Procedure Laterality Date   BREAST BIOPSY  2011   CHOLECYSTECTOMY  1991   MASTECTOMY Left 2011   NECK SURGERY  2007   Neck Fusion; Ruptured Disk   NOVASURE ABLATION  11/2014   REDUCTION MAMMAPLASTY Right 2011   TONSILLECTOMY  1988   TUBAL LIGATION      Social History   Tobacco Use   Smoking status: Never   Smokeless tobacco: Never  Substance Use Topics   Alcohol use: Yes    Alcohol/week: 2.0 standard drinks of alcohol    Types: 2 Glasses of wine per week    Comment: Occasional   Drug use: No    Family History  Problem Relation Age of Onset   Heart disease Mother    Heart disease Father    Heart attack Father    Breast cancer Maternal Aunt    Breast cancer Maternal Aunt    Diabetes Neg Hx     No Known Allergies  Medication list has been reviewed and updated.  Current Outpatient Medications on File Prior to Visit  Medication Sig Dispense Refill   diclofenac (VOLTAREN) 75 MG EC tablet Take 1 tablet (75 mg total) by mouth 2 (two) times daily. 60 tablet 1   rosuvastatin (  CRESTOR) 10 MG tablet Take 1 tablet (10 mg total) by mouth 2 (two) times a week.     sertraline (ZOLOFT) 100 MG tablet Take 1 tablet (100 mg total) by mouth daily. 90 tablet 3   zolpidem (AMBIEN) 10 MG tablet TAKE 1/2 (ONE-HALF) TABLET BY MOUTH ONCE DAILY AS NEEDED FOR INSOMNIA 15 tablet 2   No current facility-administered medications on file prior to visit.    Review of Systems:  As per HPI- otherwise negative.   Physical Examination: Vitals:   05/21/23 1409  BP: 120/80  Pulse: 87  Resp: 18  Temp: 97.8 F (36.6 C)  SpO2: 99%   Vitals:   05/21/23 1409  Weight: 168 lb 9.6 oz (76.5 kg)  Height: 5\' 4"  (1.626 m)   Body mass index is 28.94 kg/m. Ideal Body Weight: Weight in (lb) to have BMI = 25: 145.3  GEN: no acute distress. HEENT:  Atraumatic, Normocephalic.  Ears and Nose: No external deformity. CV: RRR, No M/G/R. No JVD. No thrill. No extra heart sounds. PULM: CTA B, no wheezes, crackles, rhonchi. No retractions. No resp. distress. No accessory muscle use. ABD: S, NT, ND, +BS. No rebound. No HSM. EXTR: No c/c/e PSYCH: Normally interactive. Conversant.    Assessment and Plan: Skin sore - Plan: cephALEXin (KEFLEX) 500 MG capsule  Immunization due - Plan: Flu vaccine trivalent PF, 6mos and older(Flulaval,Afluria,Fluarix,Fluzone)  Patient seen today with 2 nonspecific sores on the dorsum of her right hand.  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  There may be some picking involved.  It is do not heal however consider skin cancer  The skin lesion closest to the thumb appears mildly infected, will treat her with Keflex.  Encouraged her to try a hydrogel dressing to keep these lesions covered and help them heal.  If not healed over in 3 to 4 weeks she will let me know and I will set her up with dermatology  Signed Abbe Amsterdam, MD

## 2023-05-21 ENCOUNTER — Ambulatory Visit (INDEPENDENT_AMBULATORY_CARE_PROVIDER_SITE_OTHER): Payer: 59 | Admitting: Family Medicine

## 2023-05-21 VITALS — BP 120/80 | HR 87 | Temp 97.8°F | Resp 18 | Ht 64.0 in | Wt 168.6 lb

## 2023-05-21 DIAGNOSIS — Z23 Encounter for immunization: Secondary | ICD-10-CM | POA: Diagnosis not present

## 2023-05-21 DIAGNOSIS — L989 Disorder of the skin and subcutaneous tissue, unspecified: Secondary | ICD-10-CM

## 2023-05-21 MED ORDER — CEPHALEXIN 500 MG PO CAPS: 500.0000 mg | ORAL_CAPSULE | Freq: Two times a day (BID) | ORAL | 0 refills | Status: DC

## 2023-05-21 NOTE — Patient Instructions (Addendum)
Please try a hydrogel dressing for the spots on your hand- keep them covered and protected  Moisturize with vaseline as often as you need  Please let me know if the skin lesions are not healed up in the next 3-4 weeks

## 2023-06-19 ENCOUNTER — Other Ambulatory Visit: Payer: Self-pay | Admitting: Family Medicine

## 2023-06-19 DIAGNOSIS — G4700 Insomnia, unspecified: Secondary | ICD-10-CM

## 2023-06-19 NOTE — Telephone Encounter (Signed)
 Requesting: Ambien 10 mg  Contract: N/A UDS: N/A Last Visit: 05/21/2023 Next Visit: N/A Last Refill: 03/18/2023  Please Advise

## 2023-07-17 ENCOUNTER — Other Ambulatory Visit: Payer: Self-pay | Admitting: Family Medicine

## 2023-07-17 ENCOUNTER — Encounter: Payer: Self-pay | Admitting: Family Medicine

## 2023-07-17 DIAGNOSIS — G4700 Insomnia, unspecified: Secondary | ICD-10-CM

## 2023-07-17 MED ORDER — ZOLPIDEM TARTRATE 10 MG PO TABS: ORAL_TABLET | ORAL | 2 refills | Status: DC

## 2023-07-18 NOTE — Telephone Encounter (Signed)
 Okay for refill?

## 2023-07-21 ENCOUNTER — Ambulatory Visit
Admission: RE | Admit: 2023-07-21 | Discharge: 2023-07-21 | Disposition: A | Discharge status: Home / Self Care | Source: Ambulatory Visit | Attending: Family Medicine | Admitting: Family Medicine

## 2023-07-21 VITALS — BP 133/89 | HR 81 | Temp 99.0°F | Resp 18 | Ht 64.0 in | Wt 166.0 lb

## 2023-07-21 DIAGNOSIS — J0100 Acute maxillary sinusitis, unspecified: Secondary | ICD-10-CM | POA: Diagnosis not present

## 2023-07-21 DIAGNOSIS — R059 Cough, unspecified: Secondary | ICD-10-CM | POA: Diagnosis not present

## 2023-07-21 MED ORDER — AMOXICILLIN-POT CLAVULANATE 875-125 MG PO TABS: 1.0000 | ORAL_TABLET | Freq: Two times a day (BID) | ORAL | 0 refills | Status: DC

## 2023-07-21 MED ORDER — PROMETHAZINE-DM 6.25-15 MG/5ML PO SYRP: 5.0000 mL | ORAL_SOLUTION | Freq: Two times a day (BID) | ORAL | 0 refills | Status: AC | PRN

## 2023-07-21 NOTE — Discharge Instructions (Addendum)
 Advised patient take medication as directed with food to completion.  Advised may take promethazine DM syrup for cough.  Patient advised of sedate of effects of this medication.  Encouraged increase daily water intake to 64 ounces per day while taking these medications.  Advised if symptoms worsen and/or unresolved please follow-up with your PCP or here for further evaluation.

## 2023-07-21 NOTE — ED Triage Notes (Signed)
 Patient c/o nasal and facial congestion, productive cough started today, other sx's x 3 days.  Possible sinus infection.  Patient has been taken Sudafed and Tylenol.

## 2023-07-21 NOTE — ED Provider Notes (Signed)
 [facility]    CSN: [id number] Arrival date & time: 07/21/23  1145      History   Chief Complaint Chief Complaint  Patient presents with   Nasal Congestion    Entered by patient    HPI Shirley Holder is a 53 y.o. female.   HPI Very pleasant 53 year old female presents with nasal and facial congestion, with productive cough for 3 days.  PMH significant for left breast cancer, dyspnea on exertion, and osteopenia.  Past Medical History:  Diagnosis Date   Breast cancer (HCC) 2011   Left Breast Cancer   Cancer [facility]) 2011   Left Breast Cancer   Hepatitis C    Urinary tract infection     Patient Active Problem List   Diagnosis Date Noted   Dyspnea on exertion 06/25/2021   Family history of coronary artery disease 06/25/2021   Dizziness 06/25/2021   Greater trochanteric pain syndrome of left lower extremity 01/12/2019   Low back pain radiating to left leg 09/21/2017   History of hepatitis C 02/01/2016   Hyperlipidemia 09/21/2015   History of ductal carcinoma in situ (DCIS) of breast 09/21/2015   Osteopenia 09/21/2015   Vitamin D deficiency 09/21/2015   Hyperthyroidism 09/21/2015   Insomnia 08/14/2015   Anxiety state 08/14/2015    Past Surgical History:  Procedure Laterality Date   BREAST BIOPSY  2011   CHOLECYSTECTOMY  1991   MASTECTOMY Left 2011   NECK SURGERY  2007   Neck Fusion; Ruptured Disk   NOVASURE ABLATION  11/2014   REDUCTION MAMMAPLASTY Right 2011   TONSILLECTOMY  1988   TUBAL LIGATION      OB History   No obstetric history on file.      Home Medications    Prior to Admission medications   Medication Sig Start Date End Date Taking? Authorizing Provider  amoxicillin-clavulanate (AUGMENTIN) 875-125 MG tablet Take 1 tablet by mouth every 12 (twelve) hours. 07/21/23  Yes [name], FNP  promethazine-dextromethorphan (PROMETHAZINE-DM) 6.25-15 MG/5ML syrup Take 5 mLs by mouth 2 (two) times daily as needed for cough. 07/21/23  Yes  [name], FNP  rosuvastatin (CRESTOR) 10 MG tablet Take 1 tablet (10 mg total) by mouth 2 (two) times a week. 07/18/22  Yes Worthy Rancher B, FNP  sertraline (ZOLOFT) 100 MG tablet Take 1 tablet (100 mg total) by mouth daily. 04/14/23  Yes Copland, Gwenlyn Found, MD  zolpidem (AMBIEN) 10 MG tablet TAKE 1/2 (ONE-HALF) TABLET BY MOUTH ONCE DAILY AS NEEDED FOR INSOMNIA 07/17/23  Yes Copland, Gwenlyn Found, MD  diclofenac (VOLTAREN) 75 MG EC tablet Take 1 tablet (75 mg total) by mouth 2 (two) times daily. 06/10/22   Lenda Kelp, MD    Family History Family History  Problem Relation Age of Onset   Heart disease Mother    Heart disease Father    Heart attack Father    Breast cancer Maternal Aunt    Breast cancer Maternal Aunt    Diabetes Neg Hx     Social History Social History   Tobacco Use   Smoking status: Never   Smokeless tobacco: Never  Vaping Use   Vaping status: Never Used  Substance Use Topics   Alcohol use: Yes    Alcohol/week: 2.0 standard drinks of alcohol    Types: 2 Glasses of wine per week    Comment: Occasional   Drug use: No     Allergies   Patient has no known allergies.   Review of Systems  Review of Systems  HENT:  Positive for congestion and sinus pressure.   Respiratory:  Positive for cough.   All other systems reviewed and are negative.    Physical Exam Triage Vital Signs ED Triage Vitals  Encounter Vitals Group     BP      Systolic BP Percentile      Diastolic BP Percentile      Pulse      Resp      Temp      Temp src      SpO2      Weight      Height      Head Circumference      Peak Flow      Pain Score      Pain Loc      Pain Education      Exclude from Growth Chart    No data found.  Updated Vital Signs BP 133/89 (BP Location: Right Arm)   Pulse 81   Temp 99 F (37.2 C) (Oral)   Resp 18   Ht 5\' 4"  (1.626 m)   Wt 166 lb (75.3 kg)   SpO2 95%   BMI 28.49 kg/m    Physical Exam Vitals and nursing note reviewed.   Constitutional:      Appearance: Normal appearance. She is obese. She is ill-appearing.  HENT:     Head: Normocephalic and atraumatic.     Right Ear: Tympanic membrane and external ear normal.     Left Ear: Tympanic membrane and external ear normal.     Ears:     Comments: Moderate eustachian tube dysfunction noted bilaterally    Nose:     Right Sinus: Maxillary sinus tenderness present.     Left Sinus: Maxillary sinus tenderness present.     Comments: Turbinates are erythematous/edematous    Mouth/Throat:     Mouth: Mucous membranes are moist.     Pharynx: Oropharynx is clear.  Eyes:     Extraocular Movements: Extraocular movements intact.     Conjunctiva/sclera: Conjunctivae normal.     Pupils: Pupils are equal, round, and reactive to light.  Cardiovascular:     Rate and Rhythm: Normal rate and regular rhythm.     Pulses: Normal pulses.     Heart sounds: Normal heart sounds.  Pulmonary:     Effort: Pulmonary effort is normal.     Breath sounds: Normal breath sounds. No wheezing, rhonchi or rales.     Comments: Frequent nonproductive cough on exam Musculoskeletal:        General: Normal range of motion.     Cervical back: Normal range of motion and neck supple.  Skin:    General: Skin is warm and dry.  Neurological:     General: No focal deficit present.     Mental Status: She is alert and oriented to person, place, and time. Mental status is at baseline.  Psychiatric:        Mood and Affect: Mood normal.        Behavior: Behavior normal.      UC Treatments / Results  Labs (all labs ordered are listed, but only abnormal results are displayed) Labs Reviewed - No data to display  EKG   Radiology No results found.  Procedures Procedures (including critical care time)  Medications Ordered in UC Medications - No data to display  Initial Impression / Assessment and Plan / UC Course  I have reviewed the triage vital signs and the  nursing notes.  Pertinent  labs & imaging results that were available during my care of the patient were reviewed by me and considered in my medical decision making (see chart for details).     MDM: 1.  Acute maxillary sinusitis, recurrence not specified-Rx'd Augmentin 875/125 mg tablet: Take 1 tablet twice daily x 7 days; 2.  Cough, unspecified-Rx'd Promethazine DM 6.25-15 Mg/5 mL syrup: Take 5 mL twice daily, as needed for cough. Advised patient take medication as directed with food to completion.  Advised may take promethazine DM syrup for cough.  Patient advised of sedate of effects of this medication.  Encouraged increase daily water intake to 64 ounces per day while taking these medications.  Advised if symptoms worsen and/or unresolved please follow-up with your PCP or here for further evaluation.  Patient discharged home, hemodynamically stable. Final Clinical Impressions(s) / UC Diagnoses   Final diagnoses:  Acute maxillary sinusitis, recurrence not specified  Cough, unspecified type     Discharge Instructions      Advised patient take medication as directed with food to completion.  Advised may take promethazine DM syrup for cough.  Patient advised of sedate of effects of this medication.  Encouraged increase daily water intake to 64 ounces per day while taking these medications.  Advised if symptoms worsen and/or unresolved please follow-up with your PCP or here for further evaluation.     ED Prescriptions     Medication Sig Dispense Auth. Provider   amoxicillin-clavulanate (AUGMENTIN) 875-125 MG tablet Take 1 tablet by mouth every 12 (twelve) hours. 14 tablet [name], FNP   promethazine-dextromethorphan (PROMETHAZINE-DM) 6.25-15 MG/5ML syrup Take 5 mLs by mouth 2 (two) times daily as needed for cough. 118 mL [name], FNP      PDMP not reviewed this encounter.   [name], FNP 07/21/23 1251

## 2023-09-01 ENCOUNTER — Ambulatory Visit (INDEPENDENT_AMBULATORY_CARE_PROVIDER_SITE_OTHER): Admitting: Family Medicine

## 2023-09-01 ENCOUNTER — Other Ambulatory Visit: Payer: Self-pay

## 2023-09-01 VITALS — BP 138/78 | Ht 64.0 in | Wt 165.0 lb

## 2023-09-01 DIAGNOSIS — M25572 Pain in left ankle and joints of left foot: Secondary | ICD-10-CM

## 2023-09-01 NOTE — Patient Instructions (Signed)
 You have a severe ankle sprain. Ice the area for 15 minutes at a time, 3-4 times a day at least Aleve or ibuprofen with food for pain and inflammation as needed. Elevate above the level of your heart when possible Crutches if needed to help with walking Bear weight when tolerated Use boot when up and walking around to help with stability while you recover from this injury. Come out of the boot twice a day to do Up/down and alphabet exercises 2-3 sets of each. Consider physical therapy for strengthening and balance exercises in the future. Follow up in 2 weeks.

## 2023-09-01 NOTE — Progress Notes (Cosign Needed)
 PCP: Copland, Skipper Dumas, MD  Subjective:   HPI: Patient is a 53 y.o. female here for left ankle pain and swelling.  Reports she fell while walking downstairs and possibly either missed a step or her foot slid on 08/22/2023.  [facility], diagnosed with [facility] ankle sprain.  Recommended RICE protocol and applied Ace wrap.  Works as a Advertising copywriter, frequently walking up and down stairs.  Noted immediate bruising and swelling that has since improved.  Has been resting with her leg up for the past week.  Able to walk with ACE wrap and supportive shoe but has significant pain.  Just bought herself a walking boot.  XR of L [facility] from [facility] with report but no images: IMPRESSION:  1. No acute fracture.  2.  No malalignment.  3.  Mild degenerative changes of the ankle and midfoot.  4.  Marked soft tissue swelling about the lateral malleolus with an associated ankle effusion.  5.  Plantar calcaneal enthesophyte.   Past Medical History:  Diagnosis Date   Breast cancer (HCC) 2011   Left Breast Cancer   Cancer [facility]) 2011   Left Breast Cancer   Hepatitis C    Urinary tract infection     Current Outpatient Medications on File Prior to Visit  Medication Sig Dispense Refill   amoxicillin -clavulanate (AUGMENTIN ) 875-125 MG tablet Take 1 tablet by mouth every 12 (twelve) hours. 14 tablet 0   diclofenac  (VOLTAREN ) 75 MG EC tablet Take 1 tablet (75 mg total) by mouth 2 (two) times daily. 60 tablet 1   promethazine -dextromethorphan (PROMETHAZINE -DM) 6.25-15 MG/5ML syrup Take 5 mLs by mouth 2 (two) times daily as needed for cough. 118 mL 0   rosuvastatin  (CRESTOR ) 10 MG tablet Take 1 tablet (10 mg total) by mouth 2 (two) times a week.     sertraline  (ZOLOFT ) 100 MG tablet Take 1 tablet (100 mg total) by mouth daily. 90 tablet 3   zolpidem  (AMBIEN ) 10 MG tablet TAKE 1/2 (ONE-HALF) TABLET BY MOUTH ONCE DAILY AS NEEDED FOR INSOMNIA 15 tablet 2   No current facility-administered  medications on file prior to visit.    Past Surgical History:  Procedure Laterality Date   BREAST BIOPSY  2011   CHOLECYSTECTOMY  1991   MASTECTOMY Left 2011   NECK SURGERY  2007   Neck Fusion; Ruptured Disk   NOVASURE ABLATION  11/2014   REDUCTION MAMMAPLASTY Right 2011   TONSILLECTOMY  1988   TUBAL LIGATION      No Known Allergies  BP 138/78   Ht 5\' 4"  (1.626 m)   Wt 165 lb (74.8 kg)   BMI 28.32 kg/m      09/24/2021    9:28 AM  [facility]  Frequency of aerobic exercise (# of days/week) 0  Average time in minutes 0  Frequency of strengthening activities (# of days/week) 0        No data to display              Objective:  Physical Exam:  Gen: NAD, comfortable in exam room MSK: Left lower leg: -Ecchymosis circumferentially around distal calf.  No gross deformity noted. -TTP over lateral distal calf and lateral malleoli extending medially -5/5 dorsi flexion and plantarflexion with pain -Negative Thompson test -Positive anterior drawer with laxity and pain noted -Negative talar tilt -Unable to ambulate, presents in wheelchair  Bedside US  of the left calf/ankle: Gastrocnemius and soleus muscles intact.  Fibula without  bony abnormality.  Soft tissue edema over lateral distal calf and lateral malleoli.  Edema and some disruption ATFL fibers.  Peroneal tendons intact.  Syndesmotic junction intact.   Assessment & Plan:  1.  Left ankle pain 2/2 grade 2 ankle sprain. XR from UC on 5/2 reviewed, negative for fracture.  Bedside US  showed soft tissue edema and ATFL injury but negative for Achilles/calf muscle tear and syndesmotic injury.  Continue RICE protocol x 2 weeks with ambulation in boot and calf stretching exercises. F/u in 2 weeks.

## 2023-09-03 ENCOUNTER — Encounter: Payer: Self-pay | Admitting: Family Medicine

## 2023-09-17 ENCOUNTER — Encounter: Payer: Self-pay | Admitting: Family Medicine

## 2023-09-17 ENCOUNTER — Ambulatory Visit (INDEPENDENT_AMBULATORY_CARE_PROVIDER_SITE_OTHER): Admitting: Family Medicine

## 2023-09-17 VITALS — BP 134/81 | Ht 64.0 in | Wt 165.0 lb

## 2023-09-17 DIAGNOSIS — M25572 Pain in left ankle and joints of left foot: Secondary | ICD-10-CM

## 2023-09-17 NOTE — Progress Notes (Signed)
 PCP: Copland, Skipper Dumas, MD  Subjective:   HPI: Patient is a 53 y.o. female here for left ankle injury.  5/12: Reports she fell while walking downstairs and possibly either missed a step or her foot slid on 08/22/2023.  [facility], diagnosed with left ankle sprain.  Recommended RICE protocol and applied Ace wrap.  Works as a Advertising copywriter, frequently walking up and down stairs.  Noted immediate bruising and swelling that has since improved.  Has been resting with her leg up for the past week.  Able to walk with ACE wrap and supportive shoe but has significant pain.  Just bought herself a walking boot.  5/28: Patient reports she's improved. Wearing boot when up and walking around. Swelling improved but still present laterally. Bruising has resolved. Feels tender though including medially. Taking ibuprofen as needed and doing home motion exercises.  Past Medical History:  Diagnosis Date   Breast cancer (HCC) 2011   Left Breast Cancer   Cancer [facility]) 2011   Left Breast Cancer   Hepatitis C    Urinary tract infection     Current Outpatient Medications on File Prior to Visit  Medication Sig Dispense Refill   amoxicillin -clavulanate (AUGMENTIN ) 875-125 MG tablet Take 1 tablet by mouth every 12 (twelve) hours. 14 tablet 0   diclofenac  (VOLTAREN ) 75 MG EC tablet Take 1 tablet (75 mg total) by mouth 2 (two) times daily. 60 tablet 1   promethazine -dextromethorphan (PROMETHAZINE -DM) 6.25-15 MG/5ML syrup Take 5 mLs by mouth 2 (two) times daily as needed for cough. 118 mL 0   rosuvastatin  (CRESTOR ) 10 MG tablet Take 1 tablet (10 mg total) by mouth 2 (two) times a week.     sertraline  (ZOLOFT ) 100 MG tablet Take 1 tablet (100 mg total) by mouth daily. 90 tablet 3   zolpidem  (AMBIEN ) 10 MG tablet TAKE 1/2 (ONE-HALF) TABLET BY MOUTH ONCE DAILY AS NEEDED FOR INSOMNIA 15 tablet 2   No current facility-administered medications on file prior to visit.    Past Surgical History:   Procedure Laterality Date   BREAST BIOPSY  2011   CHOLECYSTECTOMY  1991   MASTECTOMY Left 2011   NECK SURGERY  2007   Neck Fusion; Ruptured Disk   NOVASURE ABLATION  11/2014   REDUCTION MAMMAPLASTY Right 2011   TONSILLECTOMY  1988   TUBAL LIGATION      No Known Allergies  BP 134/81   Ht 5\' 4"  (1.626 m)   Wt 165 lb (74.8 kg)   BMI 28.32 kg/m      09/24/2021    9:28 AM  [facility]  Frequency of aerobic exercise (# of days/week) 0  Average time in minutes 0  Frequency of strengthening activities (# of days/week) 0        No data to display              Objective:  Physical Exam:  Gen: NAD, comfortable in exam room  Left ankle: Mild diffuse swelling.  No other gross deformity, ecchymoses Full range of motion Mild tenderness to palpation over deltoid ligament and ATFL 1+ ant drawer and 1+ talar tilt.   Negative thompsons. NV intact distally.   Assessment & Plan:  1. Left ankle injury - 2/2 lateral ankle sprain.  Radiographs negative.  Transition from boot to ASO.  Start strengthening exercises.  Icing, elevation, with ibuprofen as needed.  Consider formal physical therapy.  Follow up in 4 weeks.

## 2023-09-17 NOTE — Patient Instructions (Signed)
 You have an ankle sprain. Ice the area for 15 minutes at a time, 3-4 times a day as needed. Ibuprofen with food for pain and inflammation as needed. Elevate above the level of your heart when possible Use ankle brace when up and walking around to help with stability while you recover from this injury. Come out of the brace twice a day to do Up/down and alphabet exercises 2-3 sets of each. Start theraband strengthening exercises now - once a day 3 sets of 10. Consider physical therapy for strengthening and balance exercises. Follow up in 4 weeks.

## 2023-10-01 NOTE — Patient Instructions (Signed)
 It was great to see you again today, I will be in touch with your blood work

## 2023-10-01 NOTE — Progress Notes (Signed)
 Pt came too late, rescheduled

## 2023-10-02 ENCOUNTER — Ambulatory Visit (INDEPENDENT_AMBULATORY_CARE_PROVIDER_SITE_OTHER): Admitting: Family Medicine

## 2023-10-02 DIAGNOSIS — Z91199 Patient's noncompliance with other medical treatment and regimen due to unspecified reason: Secondary | ICD-10-CM

## 2023-10-20 ENCOUNTER — Other Ambulatory Visit: Payer: Self-pay | Admitting: Family Medicine

## 2023-10-20 DIAGNOSIS — G4700 Insomnia, unspecified: Secondary | ICD-10-CM

## 2023-10-21 NOTE — Telephone Encounter (Signed)
 Requesting: Ambien  10 mg Contract: N/A UDS: N/A Last Visit: 05/21/2023 Next Visit: 10/30/2023 Last Refill: 07/17/2023  Please Advise

## 2023-10-22 ENCOUNTER — Encounter: Payer: Self-pay | Admitting: Family Medicine

## 2023-10-22 ENCOUNTER — Ambulatory Visit (INDEPENDENT_AMBULATORY_CARE_PROVIDER_SITE_OTHER): Admitting: Family Medicine

## 2023-10-22 VITALS — BP 149/77 | Ht 64.0 in | Wt 165.0 lb

## 2023-10-22 DIAGNOSIS — M25572 Pain in left ankle and joints of left foot: Secondary | ICD-10-CM | POA: Diagnosis not present

## 2023-10-22 NOTE — Progress Notes (Signed)
 PCP: Copland, Harlene BROCKS, MD  Subjective:   HPI: Patient is a 53 y.o. female here for left ankle injury.  5/12: Reports she fell while walking downstairs and possibly either missed a step or her foot slid on 08/22/2023.  Se[facility], diagnosed with left ankle sprain.  Recommended RICE protocol and applied Ace wrap.  Works as a Advertising copywriter, frequently walking up and down stairs.  Noted immediate bruising and swelling that has since improved.  Has been resting with her leg up for the past week.  Able to walk with ACE wrap and supportive shoe but has significant pain.  Just bought herself a walking boot.  5/28: Patient reports she's improved. Wearing boot when up and walking around. Swelling improved but still present laterally. Bruising has resolved. Feels tender though including medially. Taking ibuprofen as needed and doing home motion exercises.  7/2: Patient is doing well. No pain any longer. Still a little puffy. Wore brace up until a couple days ago and has done home exercises. Wearing a compression sock she's had for her plantar fasciitis.  Past Medical History:  Diagnosis Date   Breast cancer (HCC) 2011   Left Breast Cancer   Cancer [facility]) 2011   Left Breast Cancer   Hepatitis C    Urinary tract infection     Current Outpatient Medications on File Prior to Visit  Medication Sig Dispense Refill   diclofenac  (VOLTAREN ) 75 MG EC tablet Take 1 tablet (75 mg total) by mouth 2 (two) times daily. 60 tablet 1   promethazine -dextromethorphan (PROMETHAZINE -DM) 6.25-15 MG/5ML syrup Take 5 mLs by mouth 2 (two) times daily as needed for cough. 118 mL 0   rosuvastatin  (CRESTOR ) 10 MG tablet Take 1 tablet (10 mg total) by mouth 2 (two) times a week.     sertraline  (ZOLOFT ) 100 MG tablet Take 1 tablet (100 mg total) by mouth daily. 90 tablet 3   zolpidem  (AMBIEN ) 10 MG tablet TAKE 1/2 (ONE-HALF) TABLET BY MOUTH ONCE DAILY AS NEEDED FOR INSOMNIA 15 tablet 0   No  current facility-administered medications on file prior to visit.    Past Surgical History:  Procedure Laterality Date   BREAST BIOPSY  2011   CHOLECYSTECTOMY  1991   MASTECTOMY Left 2011   NECK SURGERY  2007   Neck Fusion; Ruptured Disk   NOVASURE ABLATION  11/2014   REDUCTION MAMMAPLASTY Right 2011   TONSILLECTOMY  1988   TUBAL LIGATION      No Known Allergies  BP (!) 149/77   Ht 5' 4 (1.626 m)   Wt 165 lb (74.8 kg)   BMI 28.32 kg/m      09/24/2021    9:28 AM  [facility] Adult [facility]  Frequency of aerobic exercise (# of days/week) 0  Average time in minutes 0  Frequency of strengthening activities (# of days/week) 0        No data to display              Objective:  Physical Exam:  Gen: NAD, comfortable in exam room  Left ankle: Minimal swelling about ankle.  No other gross deformity, ecchymoses Full range of motion No tenderness to palpation  Negative ant drawer and negative talar tilt.   NV intact distally.   Assessment & Plan:  1. Left ankle injury - 2/2 lateral ankle sprain.  Healed really well.  ASO when on irregular surfaces for 2 more weeks.  Home exercises for 2 more  weeks as well.  Icing, elevation, ibuprofen if needed for residual soreness.  Follow up as needed.

## 2023-10-28 NOTE — Patient Instructions (Signed)
 It was good to see you again today, I will be in touch with your labs as possible

## 2023-10-28 NOTE — Progress Notes (Signed)
 Rescheduled

## 2023-10-29 ENCOUNTER — Encounter: Admitting: Family Medicine

## 2023-10-30 ENCOUNTER — Ambulatory Visit (INDEPENDENT_AMBULATORY_CARE_PROVIDER_SITE_OTHER): Admitting: Family Medicine

## 2023-10-30 DIAGNOSIS — Z538 Procedure and treatment not carried out for other reasons: Secondary | ICD-10-CM

## 2023-11-08 NOTE — Progress Notes (Unsigned)
 [facility] [address], [address] [place], [place] [address] [phone]  Date:  11/13/2023   Name:  Shirley Holder   DOB:  [birth year]   MRN:  [id number]  PCP:  Watt Harlene BROCKS, MD    Chief Complaint: No chief complaint on file.   History of Present Illness:  Shirley Holder is a 53 y.o. very pleasant female patient who presents with the following:  Pt seen today for CPE Last seen by myself about one year ago for a CPE and in January for a skin sore  History of osteopenia, hyperlipidemia, breast cancer with left mastectomy 2011, vitamin D  deficiency, hypothyroidism, hepatitis C status post curative treatment, insomnia uses Ambien   Status post endometrial ablation, currently menopausal  Mammo RIGHT 8/23 Pap 2021- can update  Cologaurd last year  Need to update labs today   Crestor  Sertraline  Ambien   Patient Active Problem List   Diagnosis Date Noted   Dyspnea on exertion 06/25/2021   Family history of coronary artery disease 06/25/2021   Dizziness 06/25/2021   Greater trochanteric pain syndrome of left lower extremity 01/12/2019   Low back pain radiating to left leg 09/21/2017   History of hepatitis C 02/01/2016   Hyperlipidemia 09/21/2015   History of ductal carcinoma in situ (DCIS) of breast 09/21/2015   Osteopenia 09/21/2015   Vitamin D  deficiency 09/21/2015   Hyperthyroidism 09/21/2015   Insomnia 08/14/2015   Anxiety state 08/14/2015    Past Medical History:  Diagnosis Date   Breast cancer (HCC) 2011   Left Breast Cancer   Cancer (HCC) 2011   Left Breast Cancer   Hepatitis C    Urinary tract infection     Past Surgical History:  Procedure Laterality Date   BREAST BIOPSY  2011   CHOLECYSTECTOMY  1991   MASTECTOMY Left 2011   NECK SURGERY  2007   Neck Fusion; Ruptured Disk   NOVASURE ABLATION  11/2014   REDUCTION MAMMAPLASTY Right 2011   TONSILLECTOMY  1988   TUBAL LIGATION      Social History    Tobacco Use   Smoking status: Never   Smokeless tobacco: Never  Vaping Use   Vaping status: Never Used  Substance Use Topics   Alcohol use: Yes    Alcohol/week: 2.0 standard drinks of alcohol    Types: 2 Glasses of wine per week    Comment: Occasional   Drug use: No    Family History  Problem Relation Age of Onset   Heart disease Mother    Heart disease Father    Heart attack Father    Breast cancer Maternal Aunt    Breast cancer Maternal Aunt    Diabetes Neg Hx     No Known Allergies  Medication list has been reviewed and updated.  Current Outpatient Medications on File Prior to Visit  Medication Sig Dispense Refill   diclofenac  (VOLTAREN ) 75 MG EC tablet Take 1 tablet (75 mg total) by mouth 2 (two) times daily. 60 tablet 1   promethazine -dextromethorphan (PROMETHAZINE -DM) 6.25-15 MG/5ML syrup Take 5 mLs by mouth 2 (two) times daily as needed for cough. 118 mL 0   rosuvastatin  (CRESTOR ) 10 MG tablet Take 1 tablet (10 mg total) by mouth 2 (two) times a week.     sertraline  (ZOLOFT ) 100 MG tablet Take 1 tablet (100 mg total) by mouth daily. 90 tablet 3   zolpidem  (AMBIEN ) 10 MG tablet TAKE 1/2 (ONE-HALF) TABLET BY  MOUTH ONCE DAILY AS NEEDED FOR INSOMNIA 15 tablet 0   No current facility-administered medications on file prior to visit.    Review of Systems:  As per HPI- otherwise negative.   Physical Examination: There were no vitals filed for this visit. There were no vitals filed for this visit. There is no height or weight on file to calculate BMI. Ideal Body Weight:    GEN: no acute distress. HEENT: Atraumatic, Normocephalic.  Ears and Nose: No external deformity. CV: RRR, No M/G/R. No JVD. No thrill. No extra heart sounds. PULM: CTA B, no wheezes, crackles, rhonchi. No retractions. No resp. distress. No accessory muscle use. ABD: S, NT, ND, +BS. No rebound. No HSM. EXTR: No c/c/e PSYCH: Normally interactive. Conversant.    Assessment and  Plan: *** Physical exam today- encourage healthy diet and exercise routine Will plan further follow- up pending labs.  Signed Harlene Schroeder, MD

## 2023-11-13 ENCOUNTER — Ambulatory Visit (INDEPENDENT_AMBULATORY_CARE_PROVIDER_SITE_OTHER): Admitting: Family Medicine

## 2023-11-13 ENCOUNTER — Other Ambulatory Visit (HOSPITAL_COMMUNITY)
Admission: RE | Admit: 2023-11-13 | Discharge: 2023-11-13 | Disposition: A | Discharge status: Home / Self Care | Source: Ambulatory Visit | Attending: Family Medicine | Admitting: Family Medicine

## 2023-11-13 VITALS — BP 142/86 | HR 98 | Temp 98.2°F | Resp 18 | Ht 64.0 in | Wt 174.0 lb

## 2023-11-13 DIAGNOSIS — E0590 Thyrotoxicosis, unspecified without thyrotoxic crisis or storm: Secondary | ICD-10-CM | POA: Diagnosis not present

## 2023-11-13 DIAGNOSIS — Z0000 Encounter for general adult medical examination without abnormal findings: Secondary | ICD-10-CM | POA: Diagnosis not present

## 2023-11-13 DIAGNOSIS — Z131 Encounter for screening for diabetes mellitus: Secondary | ICD-10-CM | POA: Diagnosis not present

## 2023-11-13 DIAGNOSIS — G4700 Insomnia, unspecified: Secondary | ICD-10-CM

## 2023-11-13 DIAGNOSIS — Z124 Encounter for screening for malignant neoplasm of cervix: Secondary | ICD-10-CM

## 2023-11-13 DIAGNOSIS — Z86000 Personal history of in-situ neoplasm of breast: Secondary | ICD-10-CM | POA: Diagnosis not present

## 2023-11-13 DIAGNOSIS — E782 Mixed hyperlipidemia: Secondary | ICD-10-CM | POA: Diagnosis not present

## 2023-11-13 DIAGNOSIS — Z130 Encounter for screening for diseases of the blood and blood-forming organs and certain disorders involving the immune mechanism: Secondary | ICD-10-CM | POA: Diagnosis not present

## 2023-11-13 DIAGNOSIS — I10 Essential (primary) hypertension: Secondary | ICD-10-CM

## 2023-11-13 MED ORDER — ROSUVASTATIN CALCIUM 10 MG PO TABS
10.0000 mg | ORAL_TABLET | ORAL | 1 refills | Status: AC
Start: 2023-11-13 — End: ?

## 2023-11-13 MED ORDER — LOSARTAN POTASSIUM 25 MG PO TABS
25.0000 mg | ORAL_TABLET | Freq: Every day | ORAL | 1 refills | Status: AC
Start: 2023-11-13 — End: ?

## 2023-11-13 MED ORDER — ZOLPIDEM TARTRATE 10 MG PO TABS: ORAL_TABLET | ORAL | 0 refills | Status: DC

## 2023-11-13 NOTE — Patient Instructions (Signed)
 Good to see you today- please stop by imaging on the ground floor to set up your mammogram I will be in touch with your pap and labs Let's start on losartan  25 mg daily for your blood pressure- let me know how this works for you!

## 2023-11-14 ENCOUNTER — Encounter: Payer: Self-pay | Admitting: Family Medicine

## 2023-11-14 LAB — HEMOGLOBIN A1C: Hgb A1c MFr Bld: 5.4 % (ref 4.6–6.5)

## 2023-11-14 LAB — COMPREHENSIVE METABOLIC PANEL WITH GFR
ALT: 11 U/L (ref 0–35)
AST: 15 U/L (ref 0–37)
Albumin: 4.3 g/dL (ref 3.5–5.2)
Alkaline Phosphatase: 63 U/L (ref 39–117)
BUN: 12 mg/dL (ref 6–23)
CO2: 29 meq/L (ref 19–32)
Calcium: 9.4 mg/dL (ref 8.4–10.5)
Chloride: 104 meq/L (ref 96–112)
Creatinine, Ser: 0.77 mg/dL (ref 0.40–1.20)
GFR: 87.8 mL/min (ref >60.00)
Glucose, Bld: 67 mg/dL — ABNORMAL LOW (ref 70–99)
Potassium: 3.8 meq/L (ref 3.5–5.1)
Sodium: 143 meq/L (ref 135–145)
Total Bilirubin: 0.4 mg/dL (ref 0.2–1.2)
Total Protein: 7 g/dL (ref 6.0–8.3)

## 2023-11-14 LAB — CBC
HCT: 41.9 % (ref 36.0–46.0)
Hemoglobin: 14 g/dL (ref 12.0–15.0)
MCHC: 33.4 g/dL (ref 30.0–36.0)
MCV: 85.9 fl (ref 78.0–100.0)
Platelets: 215 K/uL (ref 150.0–400.0)
RBC: 4.88 Mil/uL (ref 3.87–5.11)
RDW: 13.6 % (ref 11.5–15.5)
WBC: 5.6 K/uL (ref 4.0–10.5)

## 2023-11-14 LAB — LIPID PANEL
Cholesterol: 194 mg/dL (ref 0–200)
HDL: 58 mg/dL (ref >39.00)
LDL Cholesterol: 108 mg/dL — ABNORMAL HIGH (ref 0–99)
NonHDL: 136.26
Total CHOL/HDL Ratio: 3
Triglycerides: 140 mg/dL (ref 0.0–149.0)
VLDL: 28 mg/dL (ref 0.0–40.0)

## 2023-11-14 LAB — TSH: TSH: 1.37 u[IU]/mL (ref 0.35–5.50)

## 2023-11-19 LAB — CYTOLOGY - PAP
Comment: NEGATIVE
Diagnosis: NEGATIVE
High risk HPV: NEGATIVE

## 2023-11-20 ENCOUNTER — Encounter: Payer: Self-pay | Admitting: Family Medicine

## 2023-12-19 ENCOUNTER — Other Ambulatory Visit: Payer: Self-pay | Admitting: Family Medicine

## 2023-12-19 DIAGNOSIS — G4700 Insomnia, unspecified: Secondary | ICD-10-CM

## 2024-01-07 ENCOUNTER — Encounter: Payer: Self-pay | Admitting: Family Medicine

## 2024-02-09 ENCOUNTER — Encounter: Payer: Self-pay | Admitting: Family Medicine

## 2024-02-09 DIAGNOSIS — F411 Generalized anxiety disorder: Secondary | ICD-10-CM

## 2024-02-09 MED ORDER — SERTRALINE HCL 100 MG PO TABS: 150.0000 mg | ORAL_TABLET | Freq: Every day | ORAL | 3 refills | Status: AC

## 2024-03-19 ENCOUNTER — Other Ambulatory Visit: Payer: Self-pay | Admitting: Family Medicine

## 2024-03-19 DIAGNOSIS — G4700 Insomnia, unspecified: Secondary | ICD-10-CM
